# Patient Record
Sex: Female | Born: 1960 | Race: White | Hispanic: No | Smoking: Current every day smoker
Health system: Southern US, Community
[De-identification: ages and names within clinical notes are randomized; demographics above are authoritative.]

## PROBLEM LIST (undated history)

## (undated) DIAGNOSIS — K635 Polyp of colon: Secondary | ICD-10-CM

## (undated) DIAGNOSIS — N390 Urinary tract infection, site not specified: Secondary | ICD-10-CM

## (undated) DIAGNOSIS — F419 Anxiety disorder, unspecified: Secondary | ICD-10-CM

## (undated) DIAGNOSIS — F32A Depression, unspecified: Secondary | ICD-10-CM

## (undated) DIAGNOSIS — F329 Major depressive disorder, single episode, unspecified: Secondary | ICD-10-CM

## (undated) DIAGNOSIS — R32 Unspecified urinary incontinence: Secondary | ICD-10-CM

## (undated) DIAGNOSIS — M1990 Unspecified osteoarthritis, unspecified site: Secondary | ICD-10-CM

## (undated) DIAGNOSIS — F319 Bipolar disorder, unspecified: Secondary | ICD-10-CM

## (undated) DIAGNOSIS — R7303 Prediabetes: Secondary | ICD-10-CM

## (undated) DIAGNOSIS — N8110 Cystocele, unspecified: Secondary | ICD-10-CM

## (undated) DIAGNOSIS — E785 Hyperlipidemia, unspecified: Secondary | ICD-10-CM

## (undated) HISTORY — DX: Depression, unspecified: F32.A

## (undated) HISTORY — DX: Unspecified osteoarthritis, unspecified site: M19.90

## (undated) HISTORY — PX: BREAST SURGERY: SHX581

## (undated) HISTORY — DX: Urinary tract infection, site not specified: N39.0

## (undated) HISTORY — DX: Major depressive disorder, single episode, unspecified: F32.9

## (undated) HISTORY — DX: Anxiety disorder, unspecified: F41.9

## (undated) HISTORY — PX: APPENDECTOMY: SHX54

## (undated) HISTORY — PX: COLON SURGERY: SHX602

## (undated) HISTORY — DX: Prediabetes: R73.03

## (undated) HISTORY — DX: Polyp of colon: K63.5

## (undated) HISTORY — DX: Cystocele, unspecified: N81.10

## (undated) HISTORY — DX: Unspecified urinary incontinence: R32

## (undated) HISTORY — DX: Hyperlipidemia, unspecified: E78.5

## (undated) HISTORY — DX: Bipolar disorder, unspecified: F31.9

---

## 1983-10-10 HISTORY — PX: CHOLECYSTECTOMY: SHX55

## 1985-10-09 HISTORY — PX: TUBAL LIGATION: SHX77

## 2010-10-09 HISTORY — PX: BREAST EXCISIONAL BIOPSY: SUR124

## 2012-10-09 HISTORY — PX: ABDOMINAL HYSTERECTOMY: SHX81

## 2013-10-09 HISTORY — PX: CYSTOCELE REPAIR: SHX163

## 2016-06-09 DIAGNOSIS — M79673 Pain in unspecified foot: Secondary | ICD-10-CM | POA: Diagnosis not present

## 2016-06-23 ENCOUNTER — Ambulatory Visit (INDEPENDENT_AMBULATORY_CARE_PROVIDER_SITE_OTHER): Payer: BLUE CROSS/BLUE SHIELD | Admitting: Podiatry

## 2016-06-23 ENCOUNTER — Ambulatory Visit (INDEPENDENT_AMBULATORY_CARE_PROVIDER_SITE_OTHER): Payer: BLUE CROSS/BLUE SHIELD

## 2016-06-23 ENCOUNTER — Encounter: Payer: Self-pay | Admitting: Podiatry

## 2016-06-23 ENCOUNTER — Ambulatory Visit: Payer: Self-pay | Admitting: Podiatry

## 2016-06-23 VITALS — BP 125/73 | HR 83 | Resp 16

## 2016-06-23 DIAGNOSIS — M6789 Other specified disorders of synovium and tendon, multiple sites: Secondary | ICD-10-CM | POA: Diagnosis not present

## 2016-06-23 DIAGNOSIS — M65872 Other synovitis and tenosynovitis, left ankle and foot: Secondary | ICD-10-CM

## 2016-06-23 DIAGNOSIS — M722 Plantar fascial fibromatosis: Secondary | ICD-10-CM

## 2016-06-23 DIAGNOSIS — M79673 Pain in unspecified foot: Secondary | ICD-10-CM | POA: Diagnosis not present

## 2016-06-23 DIAGNOSIS — M659 Synovitis and tenosynovitis, unspecified: Secondary | ICD-10-CM

## 2016-06-23 DIAGNOSIS — M76829 Posterior tibial tendinitis, unspecified leg: Secondary | ICD-10-CM

## 2016-06-23 NOTE — Progress Notes (Signed)
   Subjective:    Patient ID: Suzanne Chandler, female    DOB: 22-Oct-1960, 55 y.o.   MRN: [id number]  HPI    Review of Systems  Constitutional: Positive for activity change, diaphoresis, fatigue and unexpected weight change.  Cardiovascular: Positive for leg swelling.  Gastrointestinal: Positive for abdominal pain, constipation and diarrhea.  Endocrine: Positive for heat intolerance.  Genitourinary: Positive for frequency and urgency.  Musculoskeletal: Positive for arthralgias, gait problem and myalgias.  Neurological: Positive for weakness, light-headedness and numbness.  Hematological: Bruises/bleeds easily.  All other systems reviewed and are negative.      Objective:   Physical Exam        Assessment & Plan:

## 2016-06-25 MED ORDER — BETAMETHASONE SOD PHOS & ACET 6 (3-3) MG/ML IJ SUSP: 12.0000 mg | Freq: Once | INTRAMUSCULAR | Status: DC

## 2016-06-25 NOTE — Progress Notes (Signed)
Patient ID: Suzanne Chandler, female   DOB: 1960-11-21, 55 y.o.   MRN: [id number] Subjective:  55 year old female patient presents today for evaluation of bilateral foot and ankle pain. 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 At one point the patient did present to an urgent care, where she was prescribed a Medrol Dosepak however it did not help. Patient presents today for further treatment and evaluation  Objective: Physical Exam General: The patient is alert and oriented x3 in no acute distress.  Dermatology: Skin is warm, dry and supple bilateral lower extremities. Negative for open lesions or macerations.  Vascular: Palpable pedal pulses bilaterally. No edema or erythema noted. Capillary refill within normal limits.  Neurological: Epicritic and protective threshold grossly intact bilaterally.   Musculoskeletal Exam: Range of motion within normal limits to all pedal and ankle joints bilateral with exception of mild gastroc equinus bilaterally.. Muscle strength 5/5 in all groups bilateral.   Pain on palpation to the bilateral plantar fascia at the insertion of the calcaneus, more symptomatic on the left. Patient also has severe pain and tenderness to the anterior lateral and medial aspects of the left ankle joint. Pain on palpation to the posterior tibial tendon bilaterally however the pain is more so on the left.  Radiographic Exam:   Posterior heel spurs noted bilaterally. Normal osseous mineralization. Joint spaces preserved. No fracture/dislocation/boney destruction.     Assessment: #1 posterior tibial tendon dysfunction bilateral, greater on the left. #2 plantar fasciitis bilateral foot #3 ankle joint synovitis left #4 pain in bilateral feet and left ankle. Problem List Items Addressed This Visit    None    Visit Diagnoses    Foot pain, unspecified laterality    -  Primary   Relevant Orders   DG Foot Complete Right   DG Foot Complete Left        Plan of Care:  #1 Patient was evaluated. #2 Injection 0.5 mL Celestone Soluspan injected to the left ankle joint without incident. #3 injection of 0.5 mL Celestone Soluspan injected into the left plantar fascia at the insertion of the calcaneus. #4 today we dispensed plantar fascial braces bilaterally for midfoot arch and plantar fascial support. #5 instructed patient conservative treatment modalities including shoe gear modifications, oral anti-inflammatories, icing, and stretching exercises. #6 patient is to return to clinic in 4 weeks     Dr. Edrick Kins, [place]

## 2016-06-25 NOTE — Patient Instructions (Signed)
Plantar Fasciitis Plantar fasciitis is a painful foot condition that affects the heel. It occurs when the band of tissue that connects the toes to the heel bone (plantar fascia) becomes irritated. This can happen after exercising too much or doing other repetitive activities (overuse injury). The pain from plantar fasciitis can range from mild irritation to severe pain that makes it difficult for you to walk or move. The pain is usually worse in the morning or after you have been sitting or lying down for a while. CAUSES This condition may be caused by:  Standing for long periods of time.  Wearing shoes that do not fit.  Doing high-impact activities, including running, aerobics, and ballet.  Being overweight.  Having an abnormal way of walking (gait).  Having tight calf muscles.  Having high arches in your feet.  Starting a new athletic activity. SYMPTOMS The main symptom of this condition is heel pain. Other symptoms include:  Pain that gets worse after activity or exercise.  Pain that is worse in the morning or after resting.  Pain that goes away after you walk for a few minutes. DIAGNOSIS This condition may be diagnosed based on your signs and symptoms. Your health care provider will also do a physical exam to check for:  A tender area on the bottom of your foot.  A high arch in your foot.  Pain when you move your foot.  Difficulty moving your foot. You may also need to have imaging studies to confirm the diagnosis. These can include:  X-rays.  Ultrasound.  MRI. TREATMENT  Treatment for plantar fasciitis depends on the severity of the condition. Your treatment may include:  Rest, ice, and over-the-counter pain medicines to manage your pain.  Exercises to stretch your calves and your plantar fascia.  A splint that holds your foot in a stretched, upward position while you sleep (night splint).  Physical therapy to relieve symptoms and prevent problems in the  future.  Cortisone injections to relieve severe pain.  Extracorporeal shock wave therapy (ESWT) to stimulate damaged plantar fascia with electrical impulses. It is often used as a last resort before surgery.  Surgery, if other treatments have not worked after 12 months. HOME CARE INSTRUCTIONS  Take medicines only as directed by your health care provider.  Avoid activities that cause pain.  Roll the bottom of your foot over a bag of ice or a bottle of cold water. Do this for 20 minutes, 3-4 times a day.  Perform simple stretches as directed by your health care provider.  Try wearing athletic shoes with air-sole or gel-sole cushions or soft shoe inserts.  Wear a night splint while sleeping, if directed by your health care provider.  Keep all follow-up appointments with your health care provider. PREVENTION   Do not perform exercises or activities that cause heel pain.  Consider finding low-impact activities if you continue to have problems.  Lose weight if you need to. The best way to prevent plantar fasciitis is to avoid the activities that aggravate your plantar fascia. SEEK MEDICAL CARE IF:  Your symptoms do not go away after treatment with home care measures.  Your pain gets worse.  Your pain affects your ability to move or do your daily activities.   This information is not intended to replace advice given to you by your health care provider. Make sure you discuss any questions you have with your health care provider.   Document Released: 06/20/2001 Document Revised: 06/16/2015 Document Reviewed: 08/05/2014 Elsevier   Interactive Patient Education 2016 Elsevier Inc.  

## 2016-07-20 ENCOUNTER — Other Ambulatory Visit: Payer: Self-pay | Admitting: *Deleted

## 2016-07-20 ENCOUNTER — Inpatient Hospital Stay
Admission: RE | Admit: 2016-07-20 | Discharge: 2016-07-20 | Disposition: A | Discharge status: Home / Self Care | Payer: Self-pay | Source: Ambulatory Visit | Attending: *Deleted | Admitting: *Deleted

## 2016-07-20 DIAGNOSIS — Z9289 Personal history of other medical treatment: Secondary | ICD-10-CM

## 2016-07-21 ENCOUNTER — Encounter: Payer: Self-pay | Admitting: Podiatry

## 2016-07-21 ENCOUNTER — Ambulatory Visit (INDEPENDENT_AMBULATORY_CARE_PROVIDER_SITE_OTHER): Payer: BLUE CROSS/BLUE SHIELD | Admitting: Podiatry

## 2016-07-21 DIAGNOSIS — M7752 Other enthesopathy of left foot: Secondary | ICD-10-CM

## 2016-07-21 DIAGNOSIS — M779 Enthesopathy, unspecified: Secondary | ICD-10-CM

## 2016-07-21 DIAGNOSIS — M659 Synovitis and tenosynovitis, unspecified: Secondary | ICD-10-CM

## 2016-07-21 DIAGNOSIS — M25572 Pain in left ankle and joints of left foot: Secondary | ICD-10-CM

## 2016-07-21 DIAGNOSIS — M76822 Posterior tibial tendinitis, left leg: Secondary | ICD-10-CM

## 2016-07-23 MED ORDER — BETAMETHASONE SOD PHOS & ACET 6 (3-3) MG/ML IJ SUSP: 12.0000 mg | Freq: Once | INTRAMUSCULAR | Status: DC

## 2016-07-23 NOTE — Progress Notes (Signed)
Subjective:  Patient presents today for pain and tenderness to the left ankl and foot e. Patient relates significant pain and tenderness when walking.  Patient was last seen for plantar fasciitis to the bilateral feet and she states that her condition has improved significantly. Patient states she got new shoes and also over-the-counter inserts. She still wearing her plantar fascial braces. Patient presents for further treatment and evaluation.  Objective / Physical Exam:  General:  The patient is alert and oriented x3 in no acute distress. Dermatology:  Skin is warm, dry and supple bilateral lower extremities. Negative for open lesions or macerations. Vascular:  Palpable pedal pulses bilaterally. No edema or erythema noted. Capillary refill within normal limits. Neurological:  Epicritic and protective threshold grossly intact bilaterally.  Musculoskeletal Exam:  Pain on palpation to the anterior lateral medial aspects of the patient's left ankle. Mild edema noted.  Range of motion within normal limits to all pedal and ankle joints bilateral. Muscle strength 5/5 in all groups bilateral.   Radiographic Exam:  Normal osseous mineralization. Joint spaces preserved. No fracture/dislocation/boney destruction.    Assessment: #1 posterior tibial tendon enthesopathy left  #2 ankle pain left #3 plantar fasciitis bilateral-healed   Plan of Care:  #1 Patient was evaluated. #2 injection of 0.5 mL Celestone Soluspan injected in the patient's left ankle and insertion of the PT tendon. #3 patient is to return to clinic when necessary   Dr. Edrick Kins, [place]

## 2016-07-27 ENCOUNTER — Other Ambulatory Visit: Payer: Self-pay | Admitting: Obstetrics & Gynecology

## 2016-07-27 DIAGNOSIS — N6321 Unspecified lump in the left breast, upper outer quadrant: Secondary | ICD-10-CM

## 2016-08-02 DIAGNOSIS — F331 Major depressive disorder, recurrent, moderate: Secondary | ICD-10-CM | POA: Diagnosis not present

## 2016-08-17 ENCOUNTER — Encounter: Payer: Self-pay | Admitting: Radiology

## 2016-08-17 ENCOUNTER — Ambulatory Visit
Admission: RE | Admit: 2016-08-17 | Discharge: 2016-08-17 | Disposition: A | Discharge status: Home / Self Care | Payer: BLUE CROSS/BLUE SHIELD | Source: Ambulatory Visit | Attending: Obstetrics & Gynecology | Admitting: Obstetrics & Gynecology

## 2016-08-17 DIAGNOSIS — N6321 Unspecified lump in the left breast, upper outer quadrant: Secondary | ICD-10-CM

## 2016-08-17 DIAGNOSIS — N6489 Other specified disorders of breast: Secondary | ICD-10-CM | POA: Insufficient documentation

## 2016-08-17 DIAGNOSIS — R928 Other abnormal and inconclusive findings on diagnostic imaging of breast: Secondary | ICD-10-CM | POA: Diagnosis not present

## 2016-08-18 DIAGNOSIS — F331 Major depressive disorder, recurrent, moderate: Secondary | ICD-10-CM | POA: Diagnosis not present

## 2016-08-29 ENCOUNTER — Ambulatory Visit (INDEPENDENT_AMBULATORY_CARE_PROVIDER_SITE_OTHER): Payer: BLUE CROSS/BLUE SHIELD | Admitting: Podiatry

## 2016-08-29 ENCOUNTER — Encounter: Payer: Self-pay | Admitting: Primary Care

## 2016-08-29 ENCOUNTER — Ambulatory Visit (INDEPENDENT_AMBULATORY_CARE_PROVIDER_SITE_OTHER): Payer: BLUE CROSS/BLUE SHIELD | Admitting: Primary Care

## 2016-08-29 ENCOUNTER — Telehealth: Payer: Self-pay | Admitting: *Deleted

## 2016-08-29 VITALS — BP 108/60 | HR 61 | Temp 98.2°F | Ht 66.0 in | Wt 166.0 lb

## 2016-08-29 DIAGNOSIS — M2141 Flat foot [pes planus] (acquired), right foot: Secondary | ICD-10-CM | POA: Diagnosis not present

## 2016-08-29 DIAGNOSIS — M25572 Pain in left ankle and joints of left foot: Secondary | ICD-10-CM

## 2016-08-29 DIAGNOSIS — F418 Other specified anxiety disorders: Secondary | ICD-10-CM

## 2016-08-29 DIAGNOSIS — M659 Synovitis and tenosynovitis, unspecified: Secondary | ICD-10-CM | POA: Diagnosis not present

## 2016-08-29 DIAGNOSIS — M76822 Posterior tibial tendinitis, left leg: Secondary | ICD-10-CM | POA: Diagnosis not present

## 2016-08-29 DIAGNOSIS — M79672 Pain in left foot: Secondary | ICD-10-CM | POA: Diagnosis not present

## 2016-08-29 DIAGNOSIS — M779 Enthesopathy, unspecified: Secondary | ICD-10-CM

## 2016-08-29 DIAGNOSIS — M79671 Pain in right foot: Secondary | ICD-10-CM

## 2016-08-29 DIAGNOSIS — E785 Hyperlipidemia, unspecified: Secondary | ICD-10-CM | POA: Diagnosis not present

## 2016-08-29 DIAGNOSIS — R8290 Unspecified abnormal findings in urine: Secondary | ICD-10-CM | POA: Diagnosis not present

## 2016-08-29 DIAGNOSIS — M2142 Flat foot [pes planus] (acquired), left foot: Secondary | ICD-10-CM | POA: Diagnosis not present

## 2016-08-29 DIAGNOSIS — F419 Anxiety disorder, unspecified: Secondary | ICD-10-CM

## 2016-08-29 DIAGNOSIS — F329 Major depressive disorder, single episode, unspecified: Secondary | ICD-10-CM

## 2016-08-29 DIAGNOSIS — F32A Depression, unspecified: Secondary | ICD-10-CM | POA: Insufficient documentation

## 2016-08-29 LAB — COMPREHENSIVE METABOLIC PANEL
ALK PHOS: 91 U/L (ref 39–117)
ALT: 19 U/L (ref 0–35)
AST: 18 U/L (ref 0–37)
Albumin: 4.2 g/dL (ref 3.5–5.2)
BILIRUBIN TOTAL: 0.3 mg/dL (ref 0.2–1.2)
BUN: 16 mg/dL (ref 6–23)
CALCIUM: 10 mg/dL (ref 8.4–10.5)
CO2: 30 mEq/L (ref 19–32)
CREATININE: 0.76 mg/dL (ref 0.40–1.20)
Chloride: 106 mEq/L (ref 96–112)
GFR: 83.92 mL/min (ref >60.00)
GLUCOSE: 100 mg/dL — AB (ref 70–99)
Potassium: 4.7 mEq/L (ref 3.5–5.1)
Sodium: 142 mEq/L (ref 135–145)
TOTAL PROTEIN: 7 g/dL (ref 6.0–8.3)

## 2016-08-29 LAB — POC URINALSYSI DIPSTICK (AUTOMATED)
Bilirubin, UA: NEGATIVE
GLUCOSE UA: NEGATIVE
KETONES UA: NEGATIVE
LEUKOCYTES UA: NEGATIVE
Nitrite, UA: POSITIVE
Protein, UA: NEGATIVE
Urobilinogen, UA: NEGATIVE
pH, UA: 5.5

## 2016-08-29 LAB — LIPID PANEL
Cholesterol: 198 mg/dL (ref 0–200)
HDL: 28.5 mg/dL — AB (ref >39.00)
NONHDL: 169.65
TRIGLYCERIDES: 347 mg/dL — AB (ref 0.0–149.0)
Total CHOL/HDL Ratio: 7
VLDL: 69.4 mg/dL — ABNORMAL HIGH (ref 0.0–40.0)

## 2016-08-29 LAB — LDL CHOLESTEROL, DIRECT: Direct LDL: 116 mg/dL

## 2016-08-29 NOTE — Assessment & Plan Note (Signed)
Likely underlying bipolar disorder. Patient very energetic today, very difficult to keep on subject. Continue follow up through the [facility].

## 2016-08-29 NOTE — Patient Instructions (Addendum)
Complete lab work prior to leaving today. I will notify you of your results once received.   I will send off your urine for further testing and notify you of those results in several days.  It was a pleasure to meet you today! Please don't hesitate to call me with any questions. Welcome to [facility]!

## 2016-08-29 NOTE — Progress Notes (Signed)
Subjective:    Patient ID: Suzanne Chandler, female    DOB: 11-13-1960, 55 y.o.   MRN: [id number]  HPI  Suzanne Chandler is a 55 year old female who presents today to establish care and discuss the problems mentioned below. Will obtain old records. Her last physical was over 1 year ago.  1) Anxiety and Depression: Long history of depression, thinks she may have bipolar disorder as she will experience episodes of increased energy. She is currently following with the [facility] (Dr. Eudelia Bunch) and is also following with therapy. She was previously managed on Citalopram and Clonazepam with improvement. She is only taking Clonazepam PRN. She denies SI/HI.  2) Hyperlipidemia: Diagnosed in 2011. She was previously managed on fenofibrate in the past which caused her to feel dizzy. She is currently managed on plant sterols. She is fasting today. No recent lipid panel.  3) Foul Smelling Urine: History of prolapsed bladder. She denies vaginal itching and burning, dysuria, hematuria, abdominal pain. She's not taken anything OTC for her symptoms. She does have a history of UTI's in the past without her classic symptoms.   Review of Systems  Respiratory: Negative for shortness of breath.   Cardiovascular: Negative for chest pain.  Gastrointestinal: Negative for abdominal pain.  Genitourinary: Negative for dysuria, frequency and vaginal discharge.  Psychiatric/Behavioral: Negative for sleep disturbance. The patient is not nervous/anxious.        Following with [facility], feels well managed       Past Medical History:  Diagnosis Date  . Anxiety and depression   . Female bladder prolapse      Social History   Social History  . Marital status: Widowed    Spouse name: N/A  . Number of children: N/A  . Years of education: N/A   Occupational History  . Not on file.   Social History Main Topics  . Smoking status: Current Every Day Smoker    Packs/day: 1.00    Years: 33.00  . Smokeless  tobacco: Never Used  . Alcohol use Not on file  . Drug use: Unknown  . Sexual activity: Not on file   Other Topics Concern  . Not on file   Social History Narrative  . No narrative on file    Past Surgical History:  Procedure Laterality Date  . BREAST EXCISIONAL BIOPSY Right 05/2013   neg    Family History  Problem Relation Age of Onset  . Ovarian cancer Sister     No Known Allergies  Current Outpatient Prescriptions on File Prior to Visit  Medication Sig Dispense Refill  . clonazePAM (KLONOPIN) 0.5 MG tablet Take 0.25-0.5 mg by mouth 2 (two) times daily as needed for anxiety.     . Plant Sterols and Stanols (CHOLEST OFF PO) Take by mouth.     Current Facility-Administered Medications on File Prior to Visit  Medication Dose Route Frequency Provider Last Rate Last Dose  . betamethasone acetate-betamethasone sodium phosphate (CELESTONE) injection 12 mg  12 mg Intramuscular Once Edrick Kins, DPM      . betamethasone acetate-betamethasone sodium phosphate (CELESTONE) injection 12 mg  12 mg Intramuscular Once Edrick Kins, DPM        BP 108/60   Pulse 61   Temp 98.2 F (36.8 C) (Oral)   Ht 5\' 6"  (1.676 m)   Wt 166 lb (75.3 kg)   SpO2 98%   BMI 26.79 kg/m    Objective:   Physical Exam  Constitutional: She appears  well-nourished.  Neck: Neck supple.  Cardiovascular: Normal rate and regular rhythm.   Pulmonary/Chest: Effort normal and breath sounds normal.  Abdominal: Soft. Bowel sounds are normal. There is no tenderness. There is no CVA tenderness. A hernia is present. Hernia confirmed positive in the right inguinal area.  Skin: Skin is warm and dry.  Psychiatric: She has a normal mood and affect.  Very talkative, often went off on tangents, difficulty to keep her on subject.          Assessment & Plan:  Foul Smelling Urine:  History of UTI's without classic symptoms. Only symptom today of foul smell. No dysuria, hematuria, abdominal pain, vaginal  symptoms. UA: Negative leuks, positive nitrites, 1+ blood. Culture sent. Discussed to increase water intake, will await culture results prior to treatment.  Sheral Flow, NP

## 2016-08-29 NOTE — Assessment & Plan Note (Signed)
Endorses prior history. Lipid panel today with [facility] in the 300's.  Will have her start Fish Oil 1000 mg TID. Recheck lipids in 3 months.

## 2016-08-29 NOTE — Progress Notes (Signed)
Pre visit review using our clinic review tool, if applicable. No additional management support is needed unless otherwise documented below in the visit note. 

## 2016-08-29 NOTE — Telephone Encounter (Addendum)
-----   Message from Edrick Kins, DPM sent at 08/29/2016 11:45 AM EST ----- Regarding: MRI left foot Please arrange MRI left foot.   Dx : posterior tibial tendinitis left foot  Thanks!!!Orders faxed to [facility].

## 2016-08-29 NOTE — Progress Notes (Signed)
Subjective:  Patient presents today for follow-up evaluation of bilateral foot and ankle pain. Patient states that the injections that she received to her left posterior tibial tendon sheath helped for a couple of days. Patient states that her plantar fasciitis is no longer symptomatic. Patient also complains of moderate pain and tenderness to her left ankle joint. 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 Patient presents today for further treatment and evaluation  Objective / Physical Exam:  General:  The patient is alert and oriented x3 in no acute distress. Dermatology:  Skin is warm, dry and supple bilateral lower extremities. Negative for open lesions or macerations. Vascular:  Palpable pedal pulses bilaterally. No edema or erythema noted. Capillary refill within normal limits. Neurological:  Epicritic and protective threshold grossly intact bilaterally.  Musculoskeletal Exam:  Pain on palpation to the anterior lateral medial aspects of the patient's left ankle. Mild edema noted. Pain and tenderness also noted to the posterior tibial tendon sheath as well as the insertion of the posterior tibial tendon at the level of the navicular tuberosity. Moderate edema noted as well - unimproved since last visit. Range of motion within normal limits to all pedal and ankle joints bilateral. Muscle strength 5/5 in all groups bilateral.   Assessment: #1 posterior tibial tendon enthesopathy left  #2 ankle pain left #3 plantar fasciitis bilateral-healed  #4 pain in bilateral feet  Plan of Care:  #1 Patient was evaluated today. #2 today were going to order an MRI for the left ankle for possible posterior tibial tendon tear #3 today the patient was scanned for custom molded orthotics #4 ankle brace dispensed today for left ankle pain #5 return to clinic in 3 weeks for orthotic pickup  Dr. Edrick Kins, [place]

## 2016-09-01 ENCOUNTER — Other Ambulatory Visit: Payer: Self-pay | Admitting: Primary Care

## 2016-09-01 DIAGNOSIS — N390 Urinary tract infection, site not specified: Secondary | ICD-10-CM

## 2016-09-01 DIAGNOSIS — R319 Hematuria, unspecified: Principal | ICD-10-CM

## 2016-09-01 LAB — URINE CULTURE

## 2016-09-01 MED ORDER — SULFAMETHOXAZOLE-TRIMETHOPRIM 800-160 MG PO TABS: 1.0000 | ORAL_TABLET | Freq: Two times a day (BID) | ORAL | 0 refills | Status: DC

## 2016-09-03 ENCOUNTER — Encounter: Payer: Self-pay | Admitting: Primary Care

## 2016-09-05 ENCOUNTER — Encounter: Payer: Self-pay | Admitting: *Deleted

## 2016-09-11 ENCOUNTER — Encounter: Payer: Self-pay | Admitting: *Deleted

## 2016-09-12 ENCOUNTER — Ambulatory Visit
Admission: RE | Admit: 2016-09-12 | Discharge: 2016-09-12 | Disposition: A | Discharge status: Home / Self Care | Payer: BLUE CROSS/BLUE SHIELD | Source: Ambulatory Visit | Attending: Podiatry | Admitting: Podiatry

## 2016-09-12 DIAGNOSIS — M7989 Other specified soft tissue disorders: Secondary | ICD-10-CM | POA: Diagnosis not present

## 2016-09-12 DIAGNOSIS — M779 Enthesopathy, unspecified: Secondary | ICD-10-CM | POA: Insufficient documentation

## 2016-09-12 DIAGNOSIS — M79672 Pain in left foot: Secondary | ICD-10-CM | POA: Diagnosis not present

## 2016-09-19 ENCOUNTER — Ambulatory Visit (INDEPENDENT_AMBULATORY_CARE_PROVIDER_SITE_OTHER): Payer: BLUE CROSS/BLUE SHIELD | Admitting: Podiatry

## 2016-09-19 DIAGNOSIS — M76829 Posterior tibial tendinitis, unspecified leg: Secondary | ICD-10-CM

## 2016-09-19 DIAGNOSIS — M25572 Pain in left ankle and joints of left foot: Secondary | ICD-10-CM

## 2016-09-19 DIAGNOSIS — M2140 Flat foot [pes planus] (acquired), unspecified foot: Secondary | ICD-10-CM

## 2016-09-19 DIAGNOSIS — M76822 Posterior tibial tendinitis, left leg: Secondary | ICD-10-CM | POA: Diagnosis not present

## 2016-09-19 DIAGNOSIS — M79672 Pain in left foot: Secondary | ICD-10-CM | POA: Diagnosis not present

## 2016-09-19 DIAGNOSIS — M659 Synovitis and tenosynovitis, unspecified: Secondary | ICD-10-CM

## 2016-09-19 DIAGNOSIS — M779 Enthesopathy, unspecified: Secondary | ICD-10-CM

## 2016-09-19 DIAGNOSIS — M79671 Pain in right foot: Secondary | ICD-10-CM | POA: Diagnosis not present

## 2016-09-19 MED ORDER — NONFORMULARY OR COMPOUNDED ITEM: 1.0000 g | Freq: Four times a day (QID) | 2 refills | Status: DC

## 2016-09-19 NOTE — Patient Instructions (Signed)

## 2016-09-19 NOTE — Progress Notes (Signed)
Patient ID: Suzanne Chandler, female   DOB: May 25, 1961, 55 y.o.   MRN: [id number] Subjective:  Patient presents today for follow-up evaluation of bilateral foot and ankle pain. Patient states that the injections that she received to her left posterior tibial tendon sheath helped for a couple of days. Patient states that her plantar fasciitis is no longer symptomatic. Patient also complains of moderate pain and tenderness to her left ankle joint. 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 Patient presents today for further treatment and evaluation  Objective / Physical Exam:  General:  The patient is alert and oriented x3 in no acute distress. Dermatology:  Skin is warm, dry and supple bilateral lower extremities. Negative for open lesions or macerations. Vascular:  Palpable pedal pulses bilaterally. No edema or erythema noted. Capillary refill within normal limits. Neurological:  Epicritic and protective threshold grossly intact bilaterally.  Musculoskeletal Exam:  Pain on palpation to the anterior lateral medial aspects of the patient's left ankle. Mild edema noted. Pain and tenderness also noted to the posterior tibial tendon sheath as well as the insertion of the posterior tibial tendon at the level of the navicular tuberosity. Moderate edema noted as well - unimproved since last visit. Range of motion within normal limits to all pedal and ankle joints bilateral. Muscle strength 5/5 in all groups bilateral.   Assessment: #1 posterior tibial tendon tendinitis left  #2 ankle pain left #3 plantar fasciitis bilateral-healed  #4 pain in bilateral feet  Plan of Care:  #1 Patient was evaluated today. #2 today MRI was reviewed with findings of edema and fluid within the tendon sheath of the posterior tibial tendon as well as posterior tibial tendinosis. #3 discussed etiology and conservative management of posterior tibial tendinosis and PTTD #4 continue ankle brace. #5  today orthotics were dispensed. Verbal as well as written instructions for break-in period were provided #6 prescription for anti-inflammatory pain cream dispensed through [facility] #7 return to clinic in 4 weeks  Greater than 15 minutes were spent with the patient  Dr. Edrick Kins, [place]

## 2016-09-19 NOTE — Progress Notes (Signed)
Patient ID: Suzanne Chandler, female   DOB: March 26, 1961, 55 y.o.   MRN: [id number]  Patient presents for orthotic pick up.  Verbal and written break in and wear instructions given.  Patient will follow up in 4 weeks if symptoms worsen or fail to improve.

## 2016-09-26 DIAGNOSIS — F331 Major depressive disorder, recurrent, moderate: Secondary | ICD-10-CM | POA: Diagnosis not present

## 2016-10-17 ENCOUNTER — Ambulatory Visit: Payer: BLUE CROSS/BLUE SHIELD | Admitting: Podiatry

## 2016-10-17 DIAGNOSIS — F331 Major depressive disorder, recurrent, moderate: Secondary | ICD-10-CM | POA: Diagnosis not present

## 2016-11-07 ENCOUNTER — Encounter: Payer: Self-pay | Admitting: Podiatry

## 2016-11-07 ENCOUNTER — Ambulatory Visit (INDEPENDENT_AMBULATORY_CARE_PROVIDER_SITE_OTHER): Payer: BLUE CROSS/BLUE SHIELD | Admitting: Podiatry

## 2016-11-07 DIAGNOSIS — M2141 Flat foot [pes planus] (acquired), right foot: Secondary | ICD-10-CM

## 2016-11-07 DIAGNOSIS — M76822 Posterior tibial tendinitis, left leg: Secondary | ICD-10-CM | POA: Diagnosis not present

## 2016-11-07 DIAGNOSIS — M722 Plantar fascial fibromatosis: Secondary | ICD-10-CM

## 2016-11-07 DIAGNOSIS — M659 Synovitis and tenosynovitis, unspecified: Secondary | ICD-10-CM

## 2016-11-07 DIAGNOSIS — M25572 Pain in left ankle and joints of left foot: Secondary | ICD-10-CM | POA: Diagnosis not present

## 2016-11-07 DIAGNOSIS — M2142 Flat foot [pes planus] (acquired), left foot: Secondary | ICD-10-CM

## 2016-11-07 DIAGNOSIS — M779 Enthesopathy, unspecified: Secondary | ICD-10-CM | POA: Diagnosis not present

## 2016-11-18 NOTE — Progress Notes (Signed)
Patient ID: Suzanne Chandler, female   DOB: 12-08-1960, 56 y.o.   MRN: [id number] Subjective:  Patient presents today for follow-up treatment and evaluation of bilateral plantar fasciitis as well as posterior tibial tendinitis to the left lower extremity. Patient states the orthotics that she received last visit are doing well. She is still having pain in the left foot worse than the right. 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 Patient presents today for further treatment and evaluation  Objective / Physical Exam:  General:  The patient is alert and oriented x3 in no acute distress. Dermatology:  Skin is warm, dry and supple bilateral lower extremities. Negative for open lesions or macerations. Vascular:  Palpable pedal pulses bilaterally. No edema or erythema noted. Capillary refill within normal limits. Neurological:  Epicritic and protective threshold grossly intact bilaterally.  Musculoskeletal Exam:  Pain on palpation to the anterior lateral medial aspects of the patient's left ankle. Mild edema noted. Pain and tenderness also noted to the posterior tibial tendon sheath as well as the insertion of the posterior tibial tendon at the level of the navicular tuberosity. Moderate edema noted as well - unimproved since last visit. Range of motion within normal limits to all pedal and ankle joints bilateral. Muscle strength 5/5 in all groups bilateral.   Assessment: #1 posterior tibial tendon tendinitis left  #2 ankle pain left #3 plantar fasciitis bilateral-healed  #4 pain in bilateral feet  Plan of Care:  #1 Patient was evaluated today. #2 MRI findings of edema and fluid within the tendon sheath of the posterior tibial tendon as well as posterior tibial tendinosis. #3 cam boot dispensed today for mobilization of left lower extremity. #4 continue anti-inflammatory pain cream [facility] #5 return to clinic in 4 weeks   Dr. Edrick Kins, [place]

## 2016-11-21 DIAGNOSIS — F331 Major depressive disorder, recurrent, moderate: Secondary | ICD-10-CM | POA: Diagnosis not present

## 2016-12-05 ENCOUNTER — Encounter: Payer: Self-pay | Admitting: Podiatry

## 2016-12-05 ENCOUNTER — Ambulatory Visit (INDEPENDENT_AMBULATORY_CARE_PROVIDER_SITE_OTHER): Payer: BLUE CROSS/BLUE SHIELD | Admitting: Podiatry

## 2016-12-05 DIAGNOSIS — M76822 Posterior tibial tendinitis, left leg: Secondary | ICD-10-CM | POA: Diagnosis not present

## 2016-12-05 DIAGNOSIS — M659 Synovitis and tenosynovitis, unspecified: Secondary | ICD-10-CM

## 2016-12-05 DIAGNOSIS — M779 Enthesopathy, unspecified: Secondary | ICD-10-CM | POA: Diagnosis not present

## 2016-12-06 ENCOUNTER — Other Ambulatory Visit: Payer: Self-pay | Admitting: Primary Care

## 2016-12-06 DIAGNOSIS — E785 Hyperlipidemia, unspecified: Secondary | ICD-10-CM

## 2016-12-06 DIAGNOSIS — R739 Hyperglycemia, unspecified: Secondary | ICD-10-CM

## 2016-12-18 NOTE — Progress Notes (Signed)
Patient ID: Suzanne Chandler, female   DOB: [birth year], 56 y.o.   MRN: [id number] Subjective:  Patient presents today for follow-up evaluation of posterior tibial tendinitis as well as ankle pain to the left lower extremity. She does have a history of plantar fasciitis bilateral which is healed. Patient states that she is getting relief with the immobilization cam boot. 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 Patient presents today for further treatment and evaluation  Objective / Physical Exam:  General:  The patient is alert and oriented x3 in no acute distress. Dermatology:  Skin is warm, dry and supple bilateral lower extremities. Negative for open lesions or macerations. Vascular:  Palpable pedal pulses bilaterally. No edema or erythema noted. Capillary refill within normal limits. Neurological:  Epicritic and protective threshold grossly intact bilaterally.  Musculoskeletal Exam:  Minimal Pain on palpation to the anterior lateral medial aspects of the patient's left ankle. Mild edema noted. Pain and tenderness also noted to the posterior tibial tendon sheath as well as the insertion of the posterior tibial tendon at the level of the navicular tuberosity. Moderate edema noted as well - unimproved since last visit. Range of motion within normal limits to all pedal and ankle joints bilateral. Muscle strength 5/5 in all groups bilateral.   Assessment: #1 posterior tibial tendon tendinitis left -improving #2 ankle pain left-improving #3 plantar fasciitis bilateral-healed  #4 pain in bilateral feet  Plan of Care:  #1 Patient was evaluated today. #2 today the patient can begin transitioning out of the immobilization cam boot into good supportive tennis shoes with an ankle brace. #3 recommend an over-the-counter ankle brace #4 return to clinic when necessary  Dr. Edrick Kins, [place]

## 2016-12-19 ENCOUNTER — Other Ambulatory Visit: Payer: BLUE CROSS/BLUE SHIELD

## 2016-12-19 ENCOUNTER — Other Ambulatory Visit (INDEPENDENT_AMBULATORY_CARE_PROVIDER_SITE_OTHER): Payer: BLUE CROSS/BLUE SHIELD

## 2016-12-19 DIAGNOSIS — E785 Hyperlipidemia, unspecified: Secondary | ICD-10-CM | POA: Diagnosis not present

## 2016-12-19 DIAGNOSIS — R739 Hyperglycemia, unspecified: Secondary | ICD-10-CM

## 2016-12-19 LAB — LIPID PANEL
CHOLESTEROL: 193 mg/dL (ref 0–200)
HDL: 29.2 mg/dL — AB (ref >39.00)
LDL CALC: 131 mg/dL — AB (ref 0–99)
NonHDL: 163.63
Total CHOL/HDL Ratio: 7
Triglycerides: 164 mg/dL — ABNORMAL HIGH (ref 0.0–149.0)
VLDL: 32.8 mg/dL (ref 0.0–40.0)

## 2016-12-19 LAB — HEMOGLOBIN A1C: HEMOGLOBIN A1C: 6 % (ref 4.6–6.5)

## 2016-12-25 DIAGNOSIS — F331 Major depressive disorder, recurrent, moderate: Secondary | ICD-10-CM | POA: Diagnosis not present

## 2017-01-16 ENCOUNTER — Encounter: Payer: Self-pay | Admitting: *Deleted

## 2017-01-16 ENCOUNTER — Encounter: Payer: Self-pay | Admitting: Primary Care

## 2017-01-16 ENCOUNTER — Other Ambulatory Visit: Payer: Self-pay | Admitting: Primary Care

## 2017-01-16 ENCOUNTER — Ambulatory Visit (INDEPENDENT_AMBULATORY_CARE_PROVIDER_SITE_OTHER): Payer: BLUE CROSS/BLUE SHIELD | Admitting: Primary Care

## 2017-01-16 ENCOUNTER — Telehealth: Payer: Self-pay

## 2017-01-16 VITALS — BP 102/72 | HR 75 | Temp 97.5°F | Ht 66.0 in | Wt 166.4 lb

## 2017-01-16 DIAGNOSIS — N952 Postmenopausal atrophic vaginitis: Secondary | ICD-10-CM

## 2017-01-16 DIAGNOSIS — Z1211 Encounter for screening for malignant neoplasm of colon: Secondary | ICD-10-CM | POA: Diagnosis not present

## 2017-01-16 DIAGNOSIS — Z0000 Encounter for general adult medical examination without abnormal findings: Secondary | ICD-10-CM | POA: Diagnosis not present

## 2017-01-16 DIAGNOSIS — F331 Major depressive disorder, recurrent, moderate: Secondary | ICD-10-CM | POA: Diagnosis not present

## 2017-01-16 DIAGNOSIS — F32A Depression, unspecified: Secondary | ICD-10-CM

## 2017-01-16 DIAGNOSIS — R7303 Prediabetes: Secondary | ICD-10-CM | POA: Diagnosis not present

## 2017-01-16 DIAGNOSIS — E559 Vitamin D deficiency, unspecified: Secondary | ICD-10-CM | POA: Insufficient documentation

## 2017-01-16 DIAGNOSIS — E785 Hyperlipidemia, unspecified: Secondary | ICD-10-CM | POA: Diagnosis not present

## 2017-01-16 DIAGNOSIS — F329 Major depressive disorder, single episode, unspecified: Secondary | ICD-10-CM

## 2017-01-16 DIAGNOSIS — F418 Other specified anxiety disorders: Secondary | ICD-10-CM | POA: Diagnosis not present

## 2017-01-16 DIAGNOSIS — F419 Anxiety disorder, unspecified: Secondary | ICD-10-CM

## 2017-01-16 LAB — VITAMIN D 25 HYDROXY (VIT D DEFICIENCY, FRACTURES): VITD: 26.59 ng/mL — ABNORMAL LOW (ref 30.00–100.00)

## 2017-01-16 LAB — TSH: TSH: 1.41 u[IU]/mL (ref 0.35–4.50)

## 2017-01-16 NOTE — Patient Instructions (Addendum)
Complete lab work prior to leaving today. I will notify you of your results once received.   It's important to improve your diet by reducing consumption of fast food, fried food, processed snack foods, sugary drinks. Increase consumption of fresh vegetables and fruits, whole grains, water.  Ensure you are drinking 64 ounces of water daily.  Start exercising. You should be getting 150 minutes of moderate intensity exercise weekly.  You will be contacted regarding your referral to GI for the colonoscopy.  Please let us know if you have not heard back within one week.   Schedule your mammogram later this November 2018.  Schedule a lab only appointment in 6 months for re-evaluation of your blood sugar and cholesterol.   Follow up in 1 year for your annual exam.  It was a pleasure to see you today!   Prediabetes Prediabetes is the condition of having a blood sugar (blood glucose) level that is higher than it should be, but not high enough for you to be diagnosed with type 2 diabetes. Having prediabetes puts you at risk for developing type 2 diabetes (type 2 diabetes mellitus). Prediabetes may be called impaired glucose tolerance or impaired fasting glucose. Prediabetes usually does not cause symptoms. Your health care provider can diagnose this condition with blood tests. You may be tested for prediabetes if you are overweight and if you have at least one other risk factor for prediabetes. Risk factors for prediabetes include:  Having a family member with type 2 diabetes.  Being overweight or obese.  Being older than age 8.  Being of American-Indian, African-American, Hispanic/Latino, or Asian/Pacific Islander descent.  Having an inactive (sedentary) lifestyle.  Having a history of gestational diabetes or polycystic ovarian syndrome (PCOS).  Having low levels of good cholesterol (HDL-C) or high levels of blood fats (triglycerides).  Having high blood pressure. What is blood glucose  and how is blood glucose measured?   Blood glucose refers to the amount of glucose in your bloodstream. Glucose comes from eating foods that contain sugars and starches (carbohydrates) that the body breaks down into glucose. Your blood glucose level may be measured in mg/dL (milligrams per deciliter) or mmol/L (millimoles per liter).Your blood glucose may be checked with one or more of the following blood tests:  A fasting blood glucose (FBG) test. You will not be allowed to eat (you will fast) for at least 8 hours before a blood sample is taken.  A normal range for FBG is 70-100 mg/dl (3.9-5.6 mmol/L).  An A1c (hemoglobin A1c) blood test. This test provides information about blood glucose control over the previous 2?74months.  An oral glucose tolerance test (OGTT). This test measures your blood glucose twice:  After fasting. This is your baseline level.  Two hours after you drink a beverage that contains glucose. You may be diagnosed with prediabetes:  If your FBG is 100?125 mg/dL (5.6-6.9 mmol/L).  If your A1c level is 5.7?6.4%.  If your OGGT result is 140?199 mg/dL (7.8-11 mmol/L). These blood tests may be repeated to confirm your diagnosis. What happens if blood glucose is too high? The pancreas produces a hormone (insulin) that helps move glucose from the bloodstream into cells. When cells in the body do not respond properly to insulin that the body makes (insulin resistance), excess glucose builds up in the blood instead of going into cells. As a result, high blood glucose (hyperglycemia) can develop, which can cause many complications. This is a symptom of prediabetes. What can happen if blood  glucose stays higher than normal for a long time? Having high blood glucose for a long time is dangerous. Too much glucose in your blood can damage your nerves and blood vessels. Long-term damage can lead to complications from diabetes, which may include:  Heart  disease.  Stroke.  Blindness.  Kidney disease.  Depression.  Poor circulation in the feet and legs, which could lead to surgical removal (amputation) in severe cases. How can prediabetes be prevented from turning into type 2 diabetes?   To help prevent type 2 diabetes, take the following actions:  Be physically active.  Do moderate-intensity physical activity for at least 30 minutes on at least 5 days of the week, or as much as told by your health care provider. This could be brisk walking, biking, or water aerobics.  Ask your health care provider what activities are safe for you. A mix of physical activities may be best, such as walking, swimming, cycling, and strength training.  Lose weight as told by your health care provider.  Losing 5-7% of your body weight can reverse insulin resistance.  Your health care provider can determine how much weight loss is best for you and can help you lose weight safely.  Follow a healthy meal plan. This includes eating lean proteins, complex carbohydrates, fresh fruits and vegetables, low-fat dairy products, and healthy fats.  Follow instructions from your health care provider about eating or drinking restrictions.  Make an appointment to see a diet and nutrition specialist (registered dietitian) to help you create a healthy eating plan that is right for you.  Do not smoke or use any tobacco products, such as cigarettes, chewing tobacco, and e-cigarettes. If you need help quitting, ask your health care provider.  Take over-the-counter and prescription medicines as told by your health care provider. You may be prescribed medicines that help lower the risk of type 2 diabetes. This information is not intended to replace advice given to you by your health care provider. Make sure you discuss any questions you have with your health care provider. Document Released: 01/17/2016 Document Revised: 03/02/2016 Document Reviewed: 11/16/2015 Elsevier  Interactive Patient Education  2017 [employer].

## 2017-01-16 NOTE — Assessment & Plan Note (Signed)
Following with psychiatry and therapy now. Recent adjustment to meds, has an appointment with psychiatry today. Denies SI/HI.

## 2017-01-16 NOTE — Assessment & Plan Note (Signed)
Taking Fish Oil 1200 mg TID and OTC "cholesterol" medication. Lipids due again today.

## 2017-01-16 NOTE — Telephone Encounter (Signed)
Pt left v/m; pt was seen earlier today. Pt wants to know if estradiol 1 mg gel or cream could be prescribed. If not could pt get estrace 1 mg. [facility] [address].

## 2017-01-16 NOTE — Progress Notes (Signed)
Pre visit review using our clinic review tool, if applicable. No additional management support is needed unless otherwise documented below in the visit note. 

## 2017-01-16 NOTE — Assessment & Plan Note (Signed)
Td UTD. Mammogram UTD, due again in 2018. Colonoscopy due, ordered. Discussed the importance of a healthy diet and regular exercise in order for weight loss, and to reduce the risk of other medical diseases. Exam unremarkable. Labs with prediabetes and elevation in [place]. Continue to monitor both. Follow up annually.

## 2017-01-16 NOTE — Telephone Encounter (Signed)
What are her symptoms? Has she ever been on this type of medication in the past? Has she tried anything OTC? Please answer all questions.

## 2017-01-16 NOTE — Assessment & Plan Note (Signed)
Taking 5000 units of vitamin D twice weekly.  Vitamin D level due today.

## 2017-01-16 NOTE — Assessment & Plan Note (Signed)
Recent A1C of 6.0. Discussed the importance of a healthy diet and regular exercise in order for weight loss, and to reduce the risk of other medical diseases. Recheck A1C in 6 months.

## 2017-01-16 NOTE — Telephone Encounter (Signed)
Left message on cell phone for pt to call back her primary care office.

## 2017-01-16 NOTE — Progress Notes (Signed)
Subjective:    Patient ID: Suzanne Chandler, female    DOB: 1961/07/12, 56 y.o.   MRN: [id number]  HPI  Suzanne Chandler is a 56 year old female who presents today for complete physical.  Immunizations: -Tetanus: Completed in 2012 -Influenza: She did not complete last season   Diet: She endorses a fair diet Breakfast: Skips, Cereal Lunch: Chicken wings Dinner: Vegetables, meat, soup, starch Snacks: Chocoloate Desserts: Daily Beverages: Chocolate milk  Exercise: She does not currently exercise Eye exam: Completed several years ago Dental exam: Due soon, scheduled at the end of May Colonoscopy: Never completed. Due. Pap Smear: Hysterectomy  Mammogram: Completed completed in November 2017, due in 2018   Review of Systems  Constitutional: Negative for unexpected weight change.  HENT: Negative for rhinorrhea.   Respiratory: Negative for cough and shortness of breath.   Cardiovascular: Negative for chest pain.  Gastrointestinal: Negative for constipation and diarrhea.  Genitourinary: Negative for difficulty urinating and menstrual problem.  Musculoskeletal: Negative for arthralgias and myalgias.  Skin: Negative for rash.  Allergic/Immunologic: Negative for environmental allergies.  Neurological: Negative for dizziness, numbness and headaches.  Psychiatric/Behavioral:       Feeling stressed, following with therapist and psychiatry.       Past Medical History:  Diagnosis Date  . Anxiety and depression   . Bipolar disorder ([place])   . Borderline diabetes   . Female bladder prolapse   . Hyperlipemia   . Urinary tract infection      Social History   Social History  . Marital status: Widowed    Spouse name: N/A  . Number of children: N/A  . Years of education: N/A   Occupational History  . Not on file.   Social History Main Topics  . Smoking status: Current Every Day Smoker    Packs/day: 1.00    Years: 33.00  . Smokeless tobacco: Never Used  . Alcohol use  Not on file  . Drug use: Unknown  . Sexual activity: Not on file   Other Topics Concern  . Not on file   Social History Narrative   Widow   Works in Therapist, art.   Highest level of education is GED.   Enjoys spending time with family.    Past Surgical History:  Procedure Laterality Date  . ABDOMINAL HYSTERECTOMY  2014  . BREAST EXCISIONAL BIOPSY Right 05/2013   neg  . CHOLECYSTECTOMY  1985  . CYSTOCELE REPAIR  2015  . TUBAL LIGATION  1987    Family History  Problem Relation Age of Onset  . Ovarian cancer Sister   . Arthritis Sister   . COPD Sister   . Anxiety disorder Sister   . Hyperlipidemia Mother   . Hypertension Mother   . Diabetes Mother   . Depression Mother   . Heart disease Father   . Stroke Father   . Heart attack Father   . Depression Brother   . Anxiety disorder Brother     No Known Allergies  Current Outpatient Prescriptions on File Prior to Visit  Medication Sig Dispense Refill  . clonazePAM (KLONOPIN) 0.5 MG tablet Take 0.25-0.5 mg by mouth 2 (two) times daily as needed for anxiety.     . lamoTRIgine (LAMICTAL) 25 MG tablet   0  . NONFORMULARY OR COMPOUNDED ITEM Apply 1-2 g topically 4 (four) times daily. 120 each 2  . Plant Sterols and Stanols (CHOLEST OFF PO) Take by mouth.    . QUEtiapine (SEROQUEL XR) 50 MG  TB24 24 hr tablet Take 50 mg by mouth daily.   0  . QUEtiapine (SEROQUEL) 25 MG tablet   0  . VIIBRYD 20 MG TABS   0  . VIIBRYD STARTER PACK 10 & 20 MG KIT   0   Current Facility-Administered Medications on File Prior to Visit  Medication Dose Route Frequency Provider Last Rate Last Dose  . betamethasone acetate-betamethasone sodium phosphate (CELESTONE) injection 12 mg  12 mg Intramuscular Once Edrick Kins, DPM      . betamethasone acetate-betamethasone sodium phosphate (CELESTONE) injection 12 mg  12 mg Intramuscular Once Edrick Kins, DPM        BP 102/72   Pulse 75   Temp 97.5 F (36.4 C) (Oral)   Ht _0  (1.676 m)    Wt 166 lb 6.4 oz (75.5 kg)   SpO2 98%   BMI 26.86 kg/m    Objective:   Physical Exam  Constitutional: She is oriented to person, place, and time. She appears well-nourished.  HENT:  Right Ear: Tympanic membrane and ear canal normal.  Left Ear: Tympanic membrane and ear canal normal.  Nose: Nose normal.  Mouth/Throat: Oropharynx is clear and moist.  Eyes: Conjunctivae and EOM are normal. Pupils are equal, round, and reactive to light.  Neck: Neck supple. No thyromegaly present.  Cardiovascular: Normal rate and regular rhythm.   No murmur heard. Pulmonary/Chest: Effort normal and breath sounds normal. She has no rales.  Abdominal: Soft. Bowel sounds are normal. There is no tenderness.  Musculoskeletal: Normal range of motion.  Lymphadenopathy:    She has no cervical adenopathy.  Neurological: She is alert and oriented to person, place, and time. She has normal reflexes. No cranial nerve deficit.  Skin: Skin is warm and dry. No rash noted.  Psychiatric: She has a normal mood and affect.          Assessment & Plan:

## 2017-01-17 NOTE — Telephone Encounter (Signed)
Spoken to patient. She stated that she wondering if Suzanne Chandler can prescribed estrace 1 mg (estradiol vaginal cream/gel, USP, 0.01%). She usually get this compounded.  Patient had been usually this for a since the end of 2015 since her hysterectomy and cystocele.Patient was told to use this to keep tissue vaginal are intact.

## 2017-01-18 NOTE — Telephone Encounter (Signed)
Tried to call patient and unable to leave message since voicemail box is full.

## 2017-01-18 NOTE — Telephone Encounter (Signed)
Patient returned Chan's call.  Patient can be reached at [phone].  She'll try to clear her voice mail.

## 2017-01-18 NOTE — Telephone Encounter (Signed)
It's not something that I typically initiate. When's the last time she had this medication? Does she have a GYN that she sees, this would be for them? I wouldn't recommend it if she's not having any symptoms.

## 2017-01-19 ENCOUNTER — Encounter: Payer: Self-pay | Admitting: Gastroenterology

## 2017-01-22 NOTE — Telephone Encounter (Signed)
Spoken and notified patient of Kate's comments. Patient stated that the last time the medication was prescribed last year. She does not use the cream every day and it is used for bladder tissue.  Patient stated that if Anda Kraft cannot prescribed it then please send a referral to GYN.

## 2017-01-22 NOTE — Telephone Encounter (Signed)
Message left for patient to return my call on 01/19/2017

## 2017-01-23 MED ORDER — ESTRADIOL 0.1 MG/GM VA CREA: TOPICAL_CREAM | VAGINAL | 0 refills | Status: DC

## 2017-01-23 NOTE — Telephone Encounter (Signed)
Uses cream 7-10 days per month for symptoms of vaginal dryness. Has been using this for several years with improvement of vaginal dryness secondary to atrophy.  She would like estrace cream 1 mg sent to her pharmacy. This has been done.

## 2017-01-30 DIAGNOSIS — F331 Major depressive disorder, recurrent, moderate: Secondary | ICD-10-CM | POA: Diagnosis not present

## 2017-02-13 DIAGNOSIS — F331 Major depressive disorder, recurrent, moderate: Secondary | ICD-10-CM | POA: Diagnosis not present

## 2017-02-23 ENCOUNTER — Ambulatory Visit (AMBULATORY_SURGERY_CENTER): Payer: Self-pay

## 2017-02-23 VITALS — Ht 66.0 in | Wt 168.2 lb

## 2017-02-23 DIAGNOSIS — Z1211 Encounter for screening for malignant neoplasm of colon: Secondary | ICD-10-CM

## 2017-02-23 MED ORDER — NA SULFATE-K SULFATE-MG SULF 17.5-3.13-1.6 GM/177ML PO SOLN: 1.0000 | Freq: Once | ORAL | 0 refills | Status: AC

## 2017-02-23 NOTE — Progress Notes (Signed)
Denies allergies to eggs or soy products. Denies complication of anesthesia or sedation. Denies use of weight loss medication. Denies use of O2.   Emmi instructions given for colonoscopy.  

## 2017-02-26 ENCOUNTER — Telehealth: Payer: Self-pay | Admitting: Primary Care

## 2017-02-26 ENCOUNTER — Telehealth: Payer: Self-pay | Admitting: Gastroenterology

## 2017-02-26 ENCOUNTER — Encounter: Payer: Self-pay | Admitting: Gastroenterology

## 2017-02-26 NOTE — Telephone Encounter (Signed)
Patient stated that she have already called GI and they have addressed this issue.

## 2017-02-26 NOTE — Telephone Encounter (Signed)
Spoke with patient. Explained to patient that she is able to take medications before colonoscopy as long as it is before NPO time. Patient understands.

## 2017-02-26 NOTE — Telephone Encounter (Signed)
Have her call GI and see if it is okay to take a 14 day course of Prilosec OTC

## 2017-02-26 NOTE — Telephone Encounter (Signed)
Patient Name: Suzanne Chandler  DOB: [birth year]    Initial Comment Caller says , has questions about colonoscopy treatment, she gets a gas 'bubble' which she is able to usually take care of with anti-gas gels. It is in her abd above her naval. She has questions about her colonoscopy and the air going into her bowels ?    Nurse Assessment  Nurse: Raphael Gibney, RN, Vanita Ingles Date/Time (Eastern Time): 02/26/2017 12:13:28 PM  Confirm and document reason for call. If symptomatic, describe symptoms. ---Caller states she is scheduled for colonoscopy on June 3. Had her preview visit on May 18. has "gas bubble" above her navel. She takes simethicone medication which helps. She burps and then everything is ok. She had attack of upper GI pain on Friday which went into her chest and around her back. She vomited x 2. Took 4 Simethicone soft gels and 4 simethicone mint chewables on Friday which finally helped after about 30 min. Took glycerin suppository on Friday and Saturday night and had a BM. Feels bloated but no pain.  Does the patient have any new or worsening symptoms? ---Yes  Will a triage be completed? ---Yes  Related visit to physician within the last 2 weeks? ---No  Does the PT have any chronic conditions? (i.e. diabetes, asthma, etc.) ---No  Is this a behavioral health or substance abuse call? ---No     Guidelines    Guideline Title Affirmed Question Affirmed Notes  Abdominal Pain - Upper Abdominal pain    Final Disposition User   [facility], RN, Vanita Ingles    Comments  pt wants to know about taking simethicone prior to her colonoscopy. Advised caller to call her GI doctor. Verbalized understanding.   Disagree/Comply: Comply

## 2017-02-26 NOTE — Telephone Encounter (Signed)
Pt states that she had a gastric episode the day of her pre visit on 5.18.18 and that was the first one since around 2009, but pt is worried she might have one during her procedure on 6.1.18 and would like to know should she be worried?

## 2017-03-09 ENCOUNTER — Ambulatory Visit (AMBULATORY_SURGERY_CENTER): Payer: BLUE CROSS/BLUE SHIELD | Admitting: Gastroenterology

## 2017-03-09 ENCOUNTER — Encounter: Payer: Self-pay | Admitting: Gastroenterology

## 2017-03-09 VITALS — BP 105/65 | HR 65 | Temp 97.8°F | Resp 14 | Ht 66.0 in | Wt 168.0 lb

## 2017-03-09 DIAGNOSIS — Z1212 Encounter for screening for malignant neoplasm of rectum: Secondary | ICD-10-CM

## 2017-03-09 DIAGNOSIS — D122 Benign neoplasm of ascending colon: Secondary | ICD-10-CM | POA: Diagnosis not present

## 2017-03-09 DIAGNOSIS — Z1211 Encounter for screening for malignant neoplasm of colon: Secondary | ICD-10-CM

## 2017-03-09 DIAGNOSIS — D125 Benign neoplasm of sigmoid colon: Secondary | ICD-10-CM

## 2017-03-09 MED ORDER — OMEPRAZOLE 20 MG PO CPDR: 20.0000 mg | DELAYED_RELEASE_CAPSULE | Freq: Every day | ORAL | 3 refills | Status: DC

## 2017-03-09 MED ORDER — SODIUM CHLORIDE 0.9 % IV SOLN: 500.0000 mL | INTRAVENOUS | Status: DC

## 2017-03-09 NOTE — Op Note (Signed)
[facility] Patient Name: Suzanne Chandler Procedure Date: 03/09/2017 10:45 AM MRN: [id number] Endoscopist: Mauri Pole , MD Age: 56 Referring MD:  Date of Birth: May 18, 1961 Gender: Female Account #: [id number] Procedure:                Colonoscopy Indications:              Screening for colorectal malignant neoplasm, This                            is the patient's first colonoscopy Medicines:                Monitored Anesthesia Care Procedure:                Pre-Anesthesia Assessment:                           - Prior to the procedure, a History and Physical                            was performed, and patient medications and                            allergies were reviewed. The patient's tolerance of                            previous anesthesia was also reviewed. The risks                            and benefits of the procedure and the sedation                            options and risks were discussed with the patient.                            All questions were answered, and informed consent                            was obtained. Prior Anticoagulants: The patient has                            taken no previous anticoagulant or antiplatelet                            agents. ASA Grade Assessment: II - A patient with                            mild systemic disease. After reviewing the risks                            and benefits, the patient was deemed in                            satisfactory condition to undergo the procedure.  After obtaining informed consent, the colonoscope                            was passed under direct vision. Throughout the                            procedure, the patient's blood pressure, pulse, and                            oxygen saturations were monitored continuously. The                            Colonoscope was introduced through the anus and                            advanced to the the  cecum, identified by                            appendiceal orifice and ileocecal valve. The                            colonoscopy was performed without difficulty. The                            patient tolerated the procedure well. The quality                            of the bowel preparation was excellent. The                            ileocecal valve, appendiceal orifice, and rectum                            were photographed. Scope In: 10:58:59 AM Scope Out: 11:15:05 AM Scope Withdrawal Time: 0 hours 11 minutes 48 seconds  Total Procedure Duration: 0 hours 16 minutes 6 seconds  Findings:                 The perianal and digital rectal examinations were                            normal.                           Two sessile polyps were found in the sigmoid colon                            and ascending colon. The polyps were 5 to 8 mm in                            size. These polyps were removed with a cold snare.                            Resection and retrieval were complete.  A 2 mm polyp was found in the sigmoid colon. The                            polyp was sessile. The polyp was removed with a                            cold biopsy forceps. Resection and retrieval were                            complete.                           A 9 mm polyp was found in the sigmoid colon. The                            polyp was semi-pedunculated. The polyp was removed                            with a hot snare. Resection and retrieval were                            complete.                           Non-bleeding internal hemorrhoids were found during                            retroflexion. The hemorrhoids were small.                           A few small-mouthed diverticula were found in the                            sigmoid colon. Complications:            No immediate complications. Estimated Blood Loss:     Estimated blood loss was  minimal. Impression:               - Two 5 to 8 mm polyps in the sigmoid colon and in                            the ascending colon, removed with a cold snare.                            Resected and retrieved.                           - One 2 mm polyp in the sigmoid colon, removed with                            a cold biopsy forceps. Resected and retrieved.                           - One 9 mm polyp in the sigmoid colon, removed with  a hot snare. Resected and retrieved.                           - Non-bleeding internal hemorrhoids.                           - Diverticulosis in the sigmoid colon. Recommendation:           - Patient has a contact number available for                            emergencies. The signs and symptoms of potential                            delayed complications were discussed with the                            patient. Return to normal activities tomorrow.                            Written discharge instructions were provided to the                            patient.                           - Resume previous diet.                           - Continue present medications.                           - Await pathology results.                           - Repeat colonoscopy in 3 - 5 years for                            surveillance based on pathology results. Mauri Pole, MD 03/09/2017 11:19:47 AM This report has been signed electronically.

## 2017-03-09 NOTE — Progress Notes (Signed)
Pt states she has gastric bubble- it starts above umbilicus, moves all the way up to the shoulder- she uses gas x chews and that helps normally- last episode lasted longer and the gas x didn't help but pt concerned that this reoccurs- this has been happening for years - sometimes vomits if takes 8-12 tums or gas x   Pt's states no medical or surgical changes since previsit or office visit.

## 2017-03-09 NOTE — Progress Notes (Signed)
Called to room to assist during endoscopic procedure.  Patient ID and intended procedure confirmed with present staff. Received instructions for my participation in the procedure from the performing physician.  

## 2017-03-09 NOTE — Patient Instructions (Signed)
Handout given on polyps  YOU HAD AN ENDOSCOPIC PROCEDURE TODAY: Refer to the procedure report and other information in the discharge instructions given to you for any specific questions about what was found during the examination. If this information does not answer your questions, please call [place] office at [phone] to clarify.   YOU SHOULD EXPECT: Some feelings of bloating in the abdomen. Passage of more gas than usual. Walking can help get rid of the air that was put into your GI tract during the procedure and reduce the bloating. If you had a lower endoscopy (such as a colonoscopy or flexible sigmoidoscopy) you may notice spotting of blood in your stool or on the toilet paper. Some abdominal soreness may be present for a day or two, also.  DIET: Your first meal following the procedure should be a light meal and then it is ok to progress to your normal diet. A half-sandwich or bowl of soup is an example of a good first meal. Heavy or fried foods are harder to digest and may make you feel nauseous or bloated. Drink plenty of fluids but you should avoid alcoholic beverages for 24 hours. If you had a esophageal dilation, please see attached instructions for diet.    ACTIVITY: Your care partner should take you home directly after the procedure. You should plan to take it easy, moving slowly for the rest of the day. You can resume normal activity the day after the procedure however YOU SHOULD NOT DRIVE, use power tools, machinery or perform tasks that involve climbing or major physical exertion for 24 hours (because of the sedation medicines used during the test).   SYMPTOMS TO REPORT IMMEDIATELY: A gastroenterologist can be reached at any hour. Please call [phone]  for any of the following symptoms:  Following lower endoscopy (colonoscopy, flexible sigmoidoscopy) Excessive amounts of blood in the stool  Significant tenderness, worsening of abdominal pains  Swelling of the abdomen that is  new, acute  Fever of 100 or higher    FOLLOW UP:  If any biopsies were taken you will be contacted by phone or by letter within the next 1-3 weeks. Call [phone]  if you have not heard about the biopsies in 3 weeks.  Please also call with any specific questions about appointments or follow up tests.  

## 2017-03-09 NOTE — Progress Notes (Signed)
To recovery vss report to [employer]

## 2017-03-12 ENCOUNTER — Telehealth: Payer: Self-pay | Admitting: *Deleted

## 2017-03-12 NOTE — Telephone Encounter (Signed)
  Follow up Call-  Call back number 03/09/2017  Post procedure Call Back phone  # [phone]  Permission to leave phone message Yes  Some recent data might be hidden     No answer, left message

## 2017-03-12 NOTE — Telephone Encounter (Signed)
  Follow up Call-  Call back number 03/09/2017  Post procedure Call Back phone  # [phone]  Permission to leave phone message Yes  Some recent data might be hidden     No answer, left message.

## 2017-03-13 DIAGNOSIS — F331 Major depressive disorder, recurrent, moderate: Secondary | ICD-10-CM | POA: Diagnosis not present

## 2017-03-14 ENCOUNTER — Encounter: Payer: Self-pay | Admitting: Gastroenterology

## 2017-03-20 ENCOUNTER — Telehealth: Payer: Self-pay | Admitting: Gastroenterology

## 2017-03-20 NOTE — Telephone Encounter (Signed)
Patient is having bowel movements maybe twice a day. She feels they are of a smaller amount than she had before her colonoscopy. She denies pain, hard bowel movements or any blood. She will start including fresh vegtables and fruit in her diet. Call us back if she acutely worsens or fails to improve.

## 2017-04-17 ENCOUNTER — Other Ambulatory Visit (INDEPENDENT_AMBULATORY_CARE_PROVIDER_SITE_OTHER): Payer: BLUE CROSS/BLUE SHIELD

## 2017-04-17 DIAGNOSIS — E559 Vitamin D deficiency, unspecified: Secondary | ICD-10-CM | POA: Diagnosis not present

## 2017-04-17 LAB — VITAMIN D 25 HYDROXY (VIT D DEFICIENCY, FRACTURES): VITD: 23.22 ng/mL — ABNORMAL LOW (ref 30.00–100.00)

## 2017-04-24 ENCOUNTER — Encounter: Payer: Self-pay | Admitting: Primary Care

## 2017-04-24 ENCOUNTER — Ambulatory Visit (INDEPENDENT_AMBULATORY_CARE_PROVIDER_SITE_OTHER): Payer: BLUE CROSS/BLUE SHIELD | Admitting: Primary Care

## 2017-04-24 VITALS — BP 104/72 | HR 58 | Temp 97.8°F | Ht 66.0 in | Wt 162.8 lb

## 2017-04-24 DIAGNOSIS — E2839 Other primary ovarian failure: Secondary | ICD-10-CM

## 2017-04-24 DIAGNOSIS — F331 Major depressive disorder, recurrent, moderate: Secondary | ICD-10-CM | POA: Diagnosis not present

## 2017-04-24 DIAGNOSIS — E559 Vitamin D deficiency, unspecified: Secondary | ICD-10-CM

## 2017-04-24 MED ORDER — VITAMIN D (ERGOCALCIFEROL) 1.25 MG (50000 UNIT) PO CAPS: ORAL_CAPSULE | ORAL | 0 refills | Status: DC

## 2017-04-24 NOTE — Assessment & Plan Note (Signed)
Not taking OTC vitamin d consistently. Rx for Vitamin D 50,000 units sent to pharmacy for once weekly consumption. Check Bone Density given history of tobacco abuse.

## 2017-04-24 NOTE — Progress Notes (Signed)
Subjective:    Patient ID: Suzanne Chandler, female    DOB: 1961/07/16, 56 y.o.   MRN: [id number]  HPI  Suzanne Chandler is a 56 year old female who presents today with multiple complaints.  1) Shoulder Pain: Located to left side. Slipped and fell onto left shoulder in the ice injuring her left shoulder one winter, this occurred five years ago. She was told that this was injured moderately, no surgical intervention.   Over the last 2 weeks she's noticed left posterior shoulder pain, decrease in ROM with lateral and forward abduction. She works at [employer], lifting heavy boxes, putting up supplies.   She denies numbness/tingling, weakness. The pain will radiate from her left shoulder to her left elbow.   2) Vitamin D: Level of 23 on recheck in mid July, previous reading of 25. She's been taking 5000 units of vitamin D 3-4 times weekly on average as she forgets to take this regularly. She was once told that she has early signs of osteoporosis. She is a current smoker. No recent bone density testing.  Review of Systems  Respiratory: Negative for shortness of breath.   Cardiovascular: Negative for chest pain.  Musculoskeletal: Positive for arthralgias.  Neurological: Negative for weakness and numbness.       Past Medical History:  Diagnosis Date  . Anxiety   . Anxiety and depression   . Arthritis   . Bipolar disorder ([place])   . Borderline diabetes   . Depression   . Female bladder prolapse   . Hyperlipemia   . Urinary incontinence   . Urinary tract infection      Social History   Social History  . Marital status: Widowed    Spouse name: N/A  . Number of children: N/A  . Years of education: N/A   Occupational History  . Not on file.   Social History Main Topics  . Smoking status: Current Every Day Smoker    Packs/day: 1.00    Years: 33.00    Types: Cigarettes  . Smokeless tobacco: Never Used  . Alcohol use No  . Drug use: No  .  Sexual activity: Not on file   Other Topics Concern  . Not on file   Social History Narrative   Widow   Works in Therapist, art.   Highest level of education is GED.   Enjoys spending time with family.    Past Surgical History:  Procedure Laterality Date  . ABDOMINAL HYSTERECTOMY  2014  . BREAST EXCISIONAL BIOPSY Right 05/2013   neg  . CHOLECYSTECTOMY  1985  . CYSTOCELE REPAIR  2015  . TUBAL LIGATION  1987    Family History  Problem Relation Age of Onset  . Ovarian cancer Sister   . Arthritis Sister   . COPD Sister   . Anxiety disorder Sister   . Hyperlipidemia Mother   . Hypertension Mother   . Diabetes Mother   . Depression Mother   . Heart disease Father   . Stroke Father   . Heart attack Father   . Depression Brother   . Anxiety disorder Brother   . Colon cancer Neg Hx   . Esophageal cancer Neg Hx   . Rectal cancer Neg Hx   . Stomach cancer Neg Hx     No Known Allergies  Current Outpatient Prescriptions on File Prior to Visit  Medication Sig Dispense Refill  . Ascorbic Acid (VITAMIN C) 1000 MG tablet Take 1,000 mg by  mouth daily.    . calcium carbonate 1250 MG capsule Take 1,250 mg by mouth 2 (two) times daily with a meal.    . estradiol (ESTRACE VAGINAL) 0.1 MG/GM vaginal cream Apply vaginally as needed for vaginal dryness. 42.5 g 0  . NONFORMULARY OR COMPOUNDED ITEM Take 1 tablet by mouth daily.    . Omega-3 Fatty Acids (FISH OIL) 1000 MG CAPS Take 1,000 mg by mouth 3 (three) times daily.    . Plant Sterols and Stanols (CHOLEST OFF PO) Take 2 capsules by mouth daily.     Marland Kitchen VIIBRYD 20 MG TABS Take 10 mg by mouth every other day.   0  . clonazePAM (KLONOPIN) 0.5 MG tablet Take 0.25-0.5 mg by mouth 2 (two) times daily as needed for anxiety.     Marland Kitchen QUEtiapine (SEROQUEL) 25 MG tablet Take 12.5 mg by mouth daily.   0   Current Facility-Administered Medications on File Prior to Visit  Medication Dose Route Frequency Provider Last Rate Last Dose  . 0.9 %   sodium chloride infusion  500 mL Intravenous Continuous Nandigam, Kavitha V, MD      . betamethasone acetate-betamethasone sodium phosphate (CELESTONE) injection 12 mg  12 mg Intramuscular Once Daylene Katayama M, DPM      . betamethasone acetate-betamethasone sodium phosphate (CELESTONE) injection 12 mg  12 mg Intramuscular Once Evans, Brent M, DPM        BP 104/72   Pulse (!) 58   Temp 97.8 F (36.6 C) (Oral)   Ht 5\' 6"  (1.676 m)   Wt 162 lb 12.8 oz (73.8 kg)   SpO2 95%   BMI 26.28 kg/m    Objective:   Physical Exam  Constitutional: She appears well-nourished.  Neck: Neck supple.  Cardiovascular: Normal rate and regular rhythm.   Pulmonary/Chest: Effort normal and breath sounds normal.  Musculoskeletal:       Left shoulder: She exhibits decreased range of motion and pain. She exhibits no tenderness and no spasm.  Decrease in ROM with forward and lateral abduction. No crepitus, deformity, swelling, erythema.  Skin: Skin is warm and dry.          Assessment & Plan:  Acute Shoulder Pain:  Located to left shoulder x 10 days. History of injury 5 years ago. 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 Recommend she stretch, reduce lifting, reduce pushing/pulling. Will have her start Ibuprofen TID. She declines xray today which is reasonable. If no improvement in 1 week recommend xray, then consider PT.  Sheral Flow, NP

## 2017-04-24 NOTE — Patient Instructions (Signed)
Start vitamin D 50,000 unit capsules once weekly for 12 weeks for low vitamin D. Do this for a total of 12 weeks. Continue calcium.  Start Ibuprofen 600 mg three times daily for 1-2 weeks for shoulder pain.  Please call me in 1 week if no improvement to your shoulder, we will do an xray.  It was a pleasure to see you today!

## 2017-05-10 ENCOUNTER — Ambulatory Visit
Admission: RE | Admit: 2017-05-10 | Discharge: 2017-05-10 | Disposition: A | Discharge status: Home / Self Care | Payer: BLUE CROSS/BLUE SHIELD | Source: Ambulatory Visit | Attending: Primary Care | Admitting: Primary Care

## 2017-05-10 DIAGNOSIS — E559 Vitamin D deficiency, unspecified: Secondary | ICD-10-CM | POA: Diagnosis not present

## 2017-05-10 DIAGNOSIS — Z13820 Encounter for screening for osteoporosis: Secondary | ICD-10-CM | POA: Diagnosis not present

## 2017-05-10 DIAGNOSIS — E2839 Other primary ovarian failure: Secondary | ICD-10-CM

## 2017-05-10 DIAGNOSIS — M8588 Other specified disorders of bone density and structure, other site: Secondary | ICD-10-CM | POA: Diagnosis not present

## 2017-05-10 DIAGNOSIS — M85852 Other specified disorders of bone density and structure, left thigh: Secondary | ICD-10-CM | POA: Insufficient documentation

## 2017-07-03 ENCOUNTER — Encounter: Payer: Self-pay | Admitting: Primary Care

## 2017-07-03 ENCOUNTER — Ambulatory Visit (INDEPENDENT_AMBULATORY_CARE_PROVIDER_SITE_OTHER): Payer: BLUE CROSS/BLUE SHIELD | Admitting: Primary Care

## 2017-07-03 VITALS — BP 104/70 | HR 65 | Temp 98.1°F | Ht 66.0 in | Wt 165.8 lb

## 2017-07-03 DIAGNOSIS — R8290 Unspecified abnormal findings in urine: Secondary | ICD-10-CM | POA: Diagnosis not present

## 2017-07-03 LAB — POC URINALSYSI DIPSTICK (AUTOMATED)
BILIRUBIN UA: NEGATIVE
GLUCOSE UA: NEGATIVE
Ketones, UA: NEGATIVE
Leukocytes, UA: NEGATIVE
Nitrite, UA: NEGATIVE
Protein, UA: NEGATIVE
RBC UA: NEGATIVE
Urobilinogen, UA: 0.2 E.U./dL
pH, UA: 6 (ref 5.0–8.0)

## 2017-07-03 NOTE — Progress Notes (Signed)
Subjective:    Patient ID: Suzanne Chandler, female    DOB: 11-Sep-1961, 56 y.o.   MRN: [id number]  HPI  Ms. Maultsby is a 56 year old female with a history of hysterectomy who presents today with a chief complaint of foul smelling urine. She also reports urinary urgency and frequency. She denies hematuria, dysuria, fevers, abdominal pain, vaginal discharge/itching. Her symptoms began 2 weeks ago. She's not taken anything OTC.   Review of Systems  Constitutional: Negative for fever.  Gastrointestinal: Negative for abdominal pain and nausea.  Genitourinary: Positive for frequency and urgency. Negative for dysuria and vaginal discharge.       Past Medical History:  Diagnosis Date  . Anxiety   . Anxiety and depression   . Arthritis   . Bipolar disorder ([place])   . Borderline diabetes   . Depression   . Female bladder prolapse   . Hyperlipemia   . Urinary incontinence   . Urinary tract infection      Social History   Social History  . Marital status: Widowed    Spouse name: N/A  . Number of children: N/A  . Years of education: N/A   Occupational History  . Not on file.   Social History Main Topics  . Smoking status: Current Every Day Smoker    Packs/day: 1.00    Years: 33.00    Types: Cigarettes  . Smokeless tobacco: Never Used  . Alcohol use No  . Drug use: No  . Sexual activity: Not on file   Other Topics Concern  . Not on file   Social History Narrative   Widow   Works in Therapist, art.   Highest level of education is GED.   Enjoys spending time with family.    Past Surgical History:  Procedure Laterality Date  . ABDOMINAL HYSTERECTOMY  2014  . BREAST EXCISIONAL BIOPSY Right 05/2013   neg  . CHOLECYSTECTOMY  1985  . CYSTOCELE REPAIR  2015  . TUBAL LIGATION  1987    Family History  Problem Relation Age of Onset  . Ovarian cancer Sister   . Arthritis Sister   . COPD Sister   . Anxiety disorder Sister   . Hyperlipidemia Mother   .  Hypertension Mother   . Diabetes Mother   . Depression Mother   . Heart disease Father   . Stroke Father   . Heart attack Father   . Depression Brother   . Anxiety disorder Brother   . Colon cancer Neg Hx   . Esophageal cancer Neg Hx   . Rectal cancer Neg Hx   . Stomach cancer Neg Hx     No Known Allergies  Current Outpatient Prescriptions on File Prior to Visit  Medication Sig Dispense Refill  . Ascorbic Acid (VITAMIN C) 1000 MG tablet Take 1,000 mg by mouth daily.    . calcium carbonate 1250 MG capsule Take 1,250 mg by mouth 2 (two) times daily with a meal.    . Cholecalciferol (VITAMIN D PO) Take 5,000 Units by mouth daily.    . clonazePAM (KLONOPIN) 0.5 MG tablet Take 0.25-0.5 mg by mouth 2 (two) times daily as needed for anxiety.     Marland Kitchen estradiol (ESTRACE VAGINAL) 0.1 MG/GM vaginal cream Apply vaginally as needed for vaginal dryness. 42.5 g 0  . lamoTRIgine (LAMICTAL) 100 MG tablet Take 100 mg by mouth daily.   2  . NONFORMULARY OR COMPOUNDED ITEM Take 1 tablet by mouth daily.    Marland Kitchen  Omega-3 Fatty Acids (FISH OIL) 1000 MG CAPS Take 1,000 mg by mouth 3 (three) times daily.    . Plant Sterols and Stanols (CHOLEST OFF PO) Take 2 capsules by mouth daily.     . QUEtiapine (SEROQUEL) 25 MG tablet Take 12.5 mg by mouth daily.   0  . VIIBRYD 20 MG TABS Take 10 mg by mouth every other day.   0  . Vitamin D, Ergocalciferol, (DRISDOL) 50000 units CAPS capsule Take 1 capsule once weekly for 12 weeks. 12 capsule 0   Current Facility-Administered Medications on File Prior to Visit  Medication Dose Route Frequency Provider Last Rate Last Dose  . 0.9 %  sodium chloride infusion  500 mL Intravenous Continuous Nandigam, Kavitha V, MD      . betamethasone acetate-betamethasone sodium phosphate (CELESTONE) injection 12 mg  12 mg Intramuscular Once Daylene Katayama M, DPM      . betamethasone acetate-betamethasone sodium phosphate (CELESTONE) injection 12 mg  12 mg Intramuscular Once Evans, Brent M, DPM         BP 104/70   Pulse 65   Temp 98.1 F (36.7 C) (Oral)   Ht 5\' 6"  (1.676 m)   Wt 165 lb 12.8 oz (75.2 kg)   SpO2 98%   BMI 26.76 kg/m    Objective:   Physical Exam  Constitutional: She appears well-nourished.  Neck: Neck supple.  Cardiovascular: Normal rate and regular rhythm.   Pulmonary/Chest: Effort normal and breath sounds normal.  Abdominal: Soft. Bowel sounds are normal. There is no tenderness. There is no CVA tenderness.  Skin: Skin is warm and dry.          Assessment & Plan:  Foul Smelling Urine:  Present for the past 2 weeks, also with urgency with frequency. UA today: Negative. Exam today unremarkable, no evidence of infection or renal stone. Discussed to stay hydrated with water. Try AZO for potential interstitial cystitis.  She will update if symptoms progress.  Sheral Flow, NP

## 2017-07-03 NOTE — Patient Instructions (Addendum)
You do not have a urinary tract infection.  Ensure you are staying hydrated with water and rest. Ensure you are consuming 64 ounces of water daily.  You may try AZO tablets for your symptoms.  Please notify me if you develop fevers, blood in your urine, mid back pain that radiates around to your groin.  It was a pleasure to see you today!

## 2017-07-03 NOTE — Addendum Note (Signed)
Addended by: Jacqualin Combes on: 07/03/2017 12:32 PM   Modules accepted: Orders

## 2017-07-05 ENCOUNTER — Other Ambulatory Visit: Payer: Self-pay | Admitting: Primary Care

## 2017-07-05 DIAGNOSIS — R7303 Prediabetes: Secondary | ICD-10-CM

## 2017-07-05 DIAGNOSIS — E559 Vitamin D deficiency, unspecified: Secondary | ICD-10-CM

## 2017-07-17 ENCOUNTER — Other Ambulatory Visit (INDEPENDENT_AMBULATORY_CARE_PROVIDER_SITE_OTHER): Payer: BLUE CROSS/BLUE SHIELD

## 2017-07-17 DIAGNOSIS — E559 Vitamin D deficiency, unspecified: Secondary | ICD-10-CM | POA: Diagnosis not present

## 2017-07-17 DIAGNOSIS — R7303 Prediabetes: Secondary | ICD-10-CM

## 2017-07-17 LAB — HEMOGLOBIN A1C: HEMOGLOBIN A1C: 5.9 % (ref 4.6–6.5)

## 2017-07-17 LAB — VITAMIN D 25 HYDROXY (VIT D DEFICIENCY, FRACTURES): VITD: 44.02 ng/mL (ref 30.00–100.00)

## 2017-07-31 DIAGNOSIS — F331 Major depressive disorder, recurrent, moderate: Secondary | ICD-10-CM | POA: Diagnosis not present

## 2017-10-24 ENCOUNTER — Ambulatory Visit: Payer: BLUE CROSS/BLUE SHIELD | Admitting: Family Medicine

## 2017-10-25 ENCOUNTER — Other Ambulatory Visit: Payer: Self-pay | Admitting: *Deleted

## 2017-10-26 ENCOUNTER — Ambulatory Visit: Payer: Self-pay | Admitting: Primary Care

## 2017-10-26 DIAGNOSIS — Z0289 Encounter for other administrative examinations: Secondary | ICD-10-CM

## 2017-11-07 DIAGNOSIS — M7542 Impingement syndrome of left shoulder: Secondary | ICD-10-CM | POA: Diagnosis not present

## 2017-11-15 DIAGNOSIS — F331 Major depressive disorder, recurrent, moderate: Secondary | ICD-10-CM | POA: Diagnosis not present

## 2018-01-01 DIAGNOSIS — M7542 Impingement syndrome of left shoulder: Secondary | ICD-10-CM | POA: Diagnosis not present

## 2018-01-04 ENCOUNTER — Other Ambulatory Visit: Payer: Self-pay | Admitting: Physician Assistant

## 2018-01-04 ENCOUNTER — Ambulatory Visit: Payer: Self-pay

## 2018-01-04 DIAGNOSIS — M7542 Impingement syndrome of left shoulder: Secondary | ICD-10-CM

## 2018-01-04 NOTE — Telephone Encounter (Signed)
Pt  Has   Symptoms  Of  uti  Frequency   Discomfort  When  Urinates .  No  Availability  At  [facility]  For tomorow  At the  [place]     Reason for Disposition . Urinating more frequently than usual (i.e., frequency)  Answer Assessment - Initial Assessment Questions 1. SYMPTOM: "What's the main symptom you're concerned about?" (e.g., frequency, incontinence)     Frequent  Urination   Feels  A  Pinch   2. ONSET: "When did the  ________  start?"       3   Weeks   Ago   Worse  Over the  Last  Week    3. PAIN: "Is there any pain?" If so, ask: "How bad is it?" (Scale: 1-10; mild, moderate, severe)      Bilateral  Flank    Mild     4. CAUSE: "What do you think is causing the symptoms?"     Poss  Uti  5. OTHER SYMPTOMS: "Do you have any other symptoms?" (e.g., fever, flank pain, blood in urine, pain with urination)     Foul  Odor   Cloudy  At   Times   Feels   Heavy  All  The  Time   6. PREGNANCY: "Is there any chance you are pregnant?" "When was your last menstrual period?"        no  Protocols used: URINARY [facility]

## 2018-01-05 ENCOUNTER — Other Ambulatory Visit (HOSPITAL_COMMUNITY)
Admit: 2018-01-05 | Discharge: 2018-01-05 | Disposition: A | Discharge status: Home / Self Care | Payer: BLUE CROSS/BLUE SHIELD | Source: Other Acute Inpatient Hospital | Attending: Family Medicine | Admitting: Family Medicine

## 2018-01-05 ENCOUNTER — Encounter: Payer: Self-pay | Admitting: Family Medicine

## 2018-01-05 ENCOUNTER — Ambulatory Visit: Payer: BLUE CROSS/BLUE SHIELD | Admitting: Family Medicine

## 2018-01-05 VITALS — BP 114/70 | HR 61 | Temp 98.6°F | Wt 166.0 lb

## 2018-01-05 DIAGNOSIS — R109 Unspecified abdominal pain: Secondary | ICD-10-CM | POA: Diagnosis not present

## 2018-01-05 DIAGNOSIS — N3000 Acute cystitis without hematuria: Secondary | ICD-10-CM

## 2018-01-05 DIAGNOSIS — R350 Frequency of micturition: Secondary | ICD-10-CM | POA: Insufficient documentation

## 2018-01-05 LAB — POCT URINALYSIS DIPSTICK
Bilirubin, UA: NEGATIVE
Glucose, UA: NEGATIVE
KETONES UA: NEGATIVE
LEUKOCYTES UA: NEGATIVE
Nitrite, UA: NEGATIVE
PROTEIN UA: NEGATIVE
UROBILINOGEN UA: 0.2 U/dL
pH, UA: 6 (ref 5.0–8.0)

## 2018-01-05 NOTE — Progress Notes (Addendum)
2-3 day ho urinary frequency, malodor with minimal dysuria. No f/c. This has been occurring every 2-28mos sp hysterectomy and bladder tac. Has E2 cream but using sporadically.   Subjective:  Patient ID: Suzanne Chandler, female    DOB: 1961-01-15  Age: 57 y.o. MRN: [id number]  CC: Urinary Tract Infection (abd pain, odor, urine frequency)   HPI Suzanne Chandler presents for 2-3 d ho urinary frequency, minimal dysurary and urine malodor. Has e2 cream but uses intermittently. Having thes episodes every 3-4 mos with negative cultures. This, sp hysterectomy with bladder tuck.   Outpatient Medications Prior to Visit  Medication Sig Dispense Refill  . Ascorbic Acid (VITAMIN C) 1000 MG tablet Take 1,000 mg by mouth daily.    . calcium carbonate 1250 MG capsule Take 1,250 mg by mouth 2 (two) times daily with a meal.    . Cholecalciferol (VITAMIN D PO) Take 5,000 Units by mouth daily.    . clonazePAM (KLONOPIN) 0.5 MG tablet Take 0.25-0.5 mg by mouth 2 (two) times daily as needed for anxiety.     Marland Kitchen estradiol (ESTRACE VAGINAL) 0.1 MG/GM vaginal cream Apply vaginally as needed for vaginal dryness. 42.5 g 0  . lamoTRIgine (LAMICTAL) 100 MG tablet Take 100 mg by mouth daily.   2  . NONFORMULARY OR COMPOUNDED ITEM Take 1 tablet by mouth daily.    . Omega-3 Fatty Acids (FISH OIL) 1000 MG CAPS Take 1,000 mg by mouth 3 (three) times daily.    . Plant Sterols and Stanols (CHOLEST OFF PO) Take 2 capsules by mouth daily.     . QUEtiapine (SEROQUEL) 25 MG tablet Take 12.5 mg by mouth daily.   0  . VIIBRYD 20 MG TABS Take 10 mg by mouth every other day.   0  . Vitamin D, Ergocalciferol, (DRISDOL) 50000 units CAPS capsule Take 1 capsule once weekly for 12 weeks. 12 capsule 0  . chlorhexidine (PERIDEX) 0.12 % solution   0   Facility-Administered Medications Prior to Visit  Medication Dose Route Frequency Provider Last Rate Last Dose  . 0.9 %  sodium chloride infusion  500 mL Intravenous Continuous  Nandigam, Kavitha V, MD      . betamethasone acetate-betamethasone sodium phosphate (CELESTONE) injection 12 mg  12 mg Intramuscular Once Daylene Katayama M, DPM      . betamethasone acetate-betamethasone sodium phosphate (CELESTONE) injection 12 mg  12 mg Intramuscular Once Daylene Katayama M, DPM        ROS Review of Systems  Constitutional: Negative for chills, fatigue, fever and unexpected weight change.  HENT: Negative.   Respiratory: Negative.   Cardiovascular: Negative.   Gastrointestinal: Negative.   Endocrine: Negative for polyphagia and polyuria.  Genitourinary: Positive for frequency. Negative for difficulty urinating, dysuria, flank pain, hematuria, pelvic pain, urgency and vaginal discharge.  Hematological: Does not bruise/bleed easily.  Psychiatric/Behavioral: Negative.     Objective:  BP 114/70 (BP Location: Left Arm, Patient Position: Sitting, Cuff Size: Normal)   Pulse 61   Temp 98.6 F (37 C) (Oral)   Wt 166 lb (75.3 kg)   SpO2 98%   BMI 26.79 kg/m   BP Readings from Last 3 Encounters:  01/05/18 114/70  07/03/17 104/70  04/24/17 104/72    Wt Readings from Last 3 Encounters:  01/05/18 166 lb (75.3 kg)  07/03/17 165 lb 12.8 oz (75.2 kg)  04/24/17 162 lb 12.8 oz (73.8 kg)    Physical Exam  Constitutional: She is oriented to person, place, and time.  She appears well-developed and well-nourished. No distress.  HENT:  Head: Normocephalic and atraumatic.  Right Ear: External ear normal.  Left Ear: External ear normal.  Eyes: Right eye exhibits no discharge. Left eye exhibits no discharge. No scleral icterus.  Neck: Neck supple. No JVD present. No tracheal deviation present.  Pulmonary/Chest: Effort normal.  Neurological: She is alert and oriented to person, place, and time.  Skin: She is not diaphoretic.  Psychiatric: She has a normal mood and affect. Her behavior is normal.    Lab Results  Component Value Date   GLUCOSE 100 (H) 08/29/2016   CHOL 193  12/19/2016   TRIG 164.0 (H) 12/19/2016   HDL 29.20 (L) 12/19/2016   LDLDIRECT 116.0 08/29/2016   LDLCALC 131 (H) 12/19/2016   ALT 19 08/29/2016   AST 18 08/29/2016   NA 142 08/29/2016   K 4.7 08/29/2016   CL 106 08/29/2016   CREATININE 0.76 08/29/2016   BUN 16 08/29/2016   CO2 30 08/29/2016   TSH 1.41 01/16/2017   HGBA1C 5.9 07/17/2017    Dg Bone Density  Result Date: 05/10/2017 EXAM: DUAL X-RAY ABSORPTIOMETRY (DXA) FOR BONE MINERAL DENSITY IMPRESSION: Dear Dr. Alma Friendly, Your patient Suzanne Chandler completed a FRAX assessment on 05/10/2017 using the [place] (analysis version: 14.10) manufactured by [employer]. The following summarizes the results of our evaluation. PATIENT BIOGRAPHICAL: Name: Suzanne Chandler, Suzanne Chandler Patient ID: [id number] Birth Date: [birth year] Height:    66.0 in. Gender:     [age]    [age]       Weight:    162.8 lbs. Ethnicity:  White                            Exam Date: 05/10/2017 FRAX* RESULTS:  (version: 3.5) 10-year Probability of Fracture1 Major Osteoporotic Fracture2 Hip Fracture 6.1% 0.6% Population: [place] (Caucasian) Risk Factors: Tobacco User (Current Smoker) Based on Femur (Left) Neck BMD 1 -The 10-year probability of fracture may be lower than reported if the patient has received treatment. 2 -Major Osteoporotic Fracture: Clinical Spine, Forearm, Hip or Shoulder *FRAX is a Materials engineer of the [employer] for Metabolic Bone Disease, a [place] (WHO) [facility]. ASSESSMENT: The probability of a major osteoporotic fracture is 6.1% within the next ten years. The probability of a hip fracture is 0.6% within the next ten years. . Dear Dr. Alma Friendly, Your patient Suzanne Chandler completed a BMD test on 05/10/2017 using the [place] (analysis version: 14.10) manufactured by [employer]. The following summarizes the results of our evaluation. PATIENT BIOGRAPHICAL:  Name: Suzanne Chandler, Suzanne Chandler Patient ID: [id number] Birth Date: March 23, 1961 Height: 66.0 in. Gender: Female Exam Date: 05/10/2017 Weight: 162.8 lbs. Indications: Caucasian, Family Hx of Osteoporosis, Hysterectomy, Postmenopausal, Tobacco User(Current Smoker), Tobacco User (Current Smoker) Fractures: Treatments: Calcium, Vitamin D ASSESSMENT: The BMD measured at AP Spine L1-L4 is 0.977 g/cm2 with a T-score of -1.8. This patient is considered osteopenic according to [facility]) criteria. Site Region Measured Measured WHO Young Adult BMD Date       Age      Classification T-score AP Spine L1-L4 05/10/2017 55.8 Osteopenia -1.8 0.977 g/cm2 DualFemur Neck Left 05/10/2017 55.8 Osteopenia -1.1 0.880 g/cm2 World Health Organization [facility]) criteria for post-menopausal, Caucasian Women: Normal:       T-score at or above -1 SD Osteopenia:  T-score between -1 and -2.5 SD Osteoporosis: T-score at or below -2.5 SD RECOMMENDATIONS: [place] recommends that FDA-approved medical therapies be considered in postmenopausal women and men age 36 or older with a: 1. Hip or vertebral (clinical or morphometric) fracture. 2. T-score of < -2.5 at the spine or hip. 3. Ten-year fracture probability by FRAX of 3% or greater for hip fracture or 20% or greater for major osteoporotic fracture. All treatment decisions require clinical judgment and consideration of individual patient factors, including patient preferences, co-morbidities, previous drug use, risk factors not captured in the FRAX model (e.g. falls, vitamin D deficiency, increased bone turnover, interval significant decline in bone density) and possible under - or over-estimation of fracture risk by FRAX. All patients should ensure an adequate intake of dietary calcium (1200 mg/d) and vitamin D (800 IU daily) unless contraindicated. FOLLOW-UP: People with diagnosed cases of osteoporosis or at high risk for fracture should have regular bone mineral  density tests. For patients eligible for Medicare, routine testing is allowed once every 2 years. The testing frequency can be increased to one year for patients who have rapidly progressing disease, those who are receiving or discontinuing medical therapy to restore bone mass, or have additional risk factors. I have reviewed this report, and agree with the above findings. [facility] Radiology Electronically Signed   By: Marijo Conception, M.D.   On: 05/10/2017 08:47    Assessment & Plan:   Suzanne Chandler was seen today for urinary tract infection.  Diagnoses and all orders for this visit:  Abdominal pain, unspecified abdominal location -     POC Urinalysis Dipstick -     Urine Culture; Future  Urinary frequency -     Ambulatory referral to Urology -     Urine Culture; Future  Acute cystitis without hematuria -     sulfamethoxazole-trimethoprim (BACTRIM DS,SEPTRA DS) 800-160 MG tablet; Take 1 tablet by mouth 2 (two) times daily for 7 days.   I am having Suzanne Chandler start on sulfamethoxazole-trimethoprim. I am also having her maintain her clonazePAM, Plant Sterols and Stanols (CHOLEST OFF PO), QUEtiapine, VIIBRYD, estradiol, vitamin C, calcium carbonate, NONFORMULARY OR COMPOUNDED ITEM, Fish Oil, Cholecalciferol (VITAMIN D PO), lamoTRIgine, Vitamin D (Ergocalciferol), and chlorhexidine. We will continue to administer betamethasone acetate-betamethasone sodium phosphate, betamethasone acetate-betamethasone sodium phosphate, and sodium chloride.  Meds ordered this encounter  Medications  . sulfamethoxazole-trimethoprim (BACTRIM DS,SEPTRA DS) 800-160 MG tablet    Sig: Take 1 tablet by mouth 2 (two) times daily for 7 days.    Dispense:  14 tablet    Refill:  0   Urology refrerral due to frequency. Encouraged use of e2 cream. Also advised that urine malodor is not a sign of infection as much as it is of dehydration.   Addendum: looks as though urine culture will be positive. Sending septra to  pharmacy.  Follow-up: Return if symptoms worsen or fail to improve.  Libby Maw, MD

## 2018-01-07 MED ORDER — SULFAMETHOXAZOLE-TRIMETHOPRIM 800-160 MG PO TABS: 1.0000 | ORAL_TABLET | Freq: Two times a day (BID) | ORAL | 0 refills | Status: AC

## 2018-01-07 NOTE — Addendum Note (Signed)
Addended by: Jon Billings on: 01/07/2018 09:27 AM   Modules accepted: Orders

## 2018-01-08 ENCOUNTER — Telehealth: Payer: Self-pay | Admitting: Primary Care

## 2018-01-08 LAB — URINE CULTURE

## 2018-01-08 NOTE — Telephone Encounter (Signed)
Copied from [place] [id number]. Topic: Quick Communication - See Telephone Encounter >> Jan 08, 2018 11:27 AM Synthia Innocent wrote: CRM for notification. See Telephone encounter for: 01/08/18.Dr Ethelene Hal is working on referral to urologist, would like Allie Bossier to look into that as well. Repeat on lab work? Please advise Would her insurance come glucose meter? Should would to start checking her sugars.

## 2018-01-08 NOTE — Telephone Encounter (Signed)
Looks like patient's due for CPE, please schedule her so we can repeat labs and discuss her concerns.

## 2018-01-08 NOTE — Telephone Encounter (Signed)
Message left for patient to return my call.  

## 2018-01-09 NOTE — Telephone Encounter (Signed)
Spoken to patient and CPE with lab prior has been scheduled.

## 2018-01-10 ENCOUNTER — Ambulatory Visit
Admission: RE | Admit: 2018-01-10 | Discharge: 2018-01-10 | Disposition: A | Discharge status: Home / Self Care | Payer: BLUE CROSS/BLUE SHIELD | Source: Ambulatory Visit | Attending: Physician Assistant | Admitting: Physician Assistant

## 2018-01-10 DIAGNOSIS — M7542 Impingement syndrome of left shoulder: Secondary | ICD-10-CM

## 2018-01-10 DIAGNOSIS — M75102 Unspecified rotator cuff tear or rupture of left shoulder, not specified as traumatic: Secondary | ICD-10-CM | POA: Diagnosis not present

## 2018-01-14 ENCOUNTER — Other Ambulatory Visit: Payer: Self-pay | Admitting: Primary Care

## 2018-01-14 DIAGNOSIS — E559 Vitamin D deficiency, unspecified: Secondary | ICD-10-CM

## 2018-01-14 DIAGNOSIS — R7303 Prediabetes: Secondary | ICD-10-CM

## 2018-01-14 DIAGNOSIS — R8290 Unspecified abnormal findings in urine: Secondary | ICD-10-CM

## 2018-01-14 DIAGNOSIS — E7800 Pure hypercholesterolemia, unspecified: Secondary | ICD-10-CM

## 2018-01-21 DIAGNOSIS — M75102 Unspecified rotator cuff tear or rupture of left shoulder, not specified as traumatic: Secondary | ICD-10-CM | POA: Diagnosis not present

## 2018-01-23 ENCOUNTER — Other Ambulatory Visit (INDEPENDENT_AMBULATORY_CARE_PROVIDER_SITE_OTHER): Payer: BLUE CROSS/BLUE SHIELD

## 2018-01-23 DIAGNOSIS — E7800 Pure hypercholesterolemia, unspecified: Secondary | ICD-10-CM

## 2018-01-23 DIAGNOSIS — R7303 Prediabetes: Secondary | ICD-10-CM

## 2018-01-23 DIAGNOSIS — R8290 Unspecified abnormal findings in urine: Secondary | ICD-10-CM

## 2018-01-23 DIAGNOSIS — E559 Vitamin D deficiency, unspecified: Secondary | ICD-10-CM | POA: Diagnosis not present

## 2018-01-23 LAB — HEMOGLOBIN A1C: HEMOGLOBIN A1C: 5.9 % (ref 4.6–6.5)

## 2018-01-23 LAB — POC URINALSYSI DIPSTICK (AUTOMATED)
BILIRUBIN UA: NEGATIVE
Glucose, UA: NEGATIVE
Ketones, UA: NEGATIVE
Leukocytes, UA: NEGATIVE
NITRITE UA: NEGATIVE
PH UA: 6 (ref 5.0–8.0)
Protein, UA: NEGATIVE
Spec Grav, UA: >=1.03 — AB (ref 1.010–1.025)
UROBILINOGEN UA: 0.2 U/dL

## 2018-01-23 LAB — LIPID PANEL
Cholesterol: 191 mg/dL (ref 0–200)
HDL: 33.4 mg/dL — AB (ref >39.00)
NonHDL: 157.33
TRIGLYCERIDES: 236 mg/dL — AB (ref 0.0–149.0)
Total CHOL/HDL Ratio: 6
VLDL: 47.2 mg/dL — ABNORMAL HIGH (ref 0.0–40.0)

## 2018-01-23 LAB — COMPREHENSIVE METABOLIC PANEL
ALT: 11 U/L (ref 0–35)
AST: 11 U/L (ref 0–37)
Albumin: 4.3 g/dL (ref 3.5–5.2)
Alkaline Phosphatase: 65 U/L (ref 39–117)
BUN: 20 mg/dL (ref 6–23)
CALCIUM: 9.8 mg/dL (ref 8.4–10.5)
CHLORIDE: 104 meq/L (ref 96–112)
CO2: 27 mEq/L (ref 19–32)
CREATININE: 0.74 mg/dL (ref 0.40–1.20)
GFR: 86.1 mL/min (ref >60.00)
Glucose, Bld: 101 mg/dL — ABNORMAL HIGH (ref 70–99)
POTASSIUM: 4.2 meq/L (ref 3.5–5.1)
Sodium: 139 mEq/L (ref 135–145)
Total Bilirubin: 0.4 mg/dL (ref 0.2–1.2)
Total Protein: 7 g/dL (ref 6.0–8.3)

## 2018-01-23 LAB — VITAMIN D 25 HYDROXY (VIT D DEFICIENCY, FRACTURES): VITD: 23.11 ng/mL — ABNORMAL LOW (ref 30.00–100.00)

## 2018-01-23 LAB — LDL CHOLESTEROL, DIRECT: LDL DIRECT: 139 mg/dL

## 2018-01-29 ENCOUNTER — Ambulatory Visit (INDEPENDENT_AMBULATORY_CARE_PROVIDER_SITE_OTHER): Payer: BLUE CROSS/BLUE SHIELD | Admitting: Primary Care

## 2018-01-29 ENCOUNTER — Encounter: Payer: Self-pay | Admitting: Primary Care

## 2018-01-29 VITALS — BP 112/66 | HR 60 | Temp 98.3°F | Ht 66.0 in | Wt 166.5 lb

## 2018-01-29 DIAGNOSIS — F32A Depression, unspecified: Secondary | ICD-10-CM

## 2018-01-29 DIAGNOSIS — R7303 Prediabetes: Secondary | ICD-10-CM

## 2018-01-29 DIAGNOSIS — E785 Hyperlipidemia, unspecified: Secondary | ICD-10-CM | POA: Diagnosis not present

## 2018-01-29 DIAGNOSIS — M25519 Pain in unspecified shoulder: Secondary | ICD-10-CM

## 2018-01-29 DIAGNOSIS — Z0000 Encounter for general adult medical examination without abnormal findings: Secondary | ICD-10-CM | POA: Diagnosis not present

## 2018-01-29 DIAGNOSIS — M25512 Pain in left shoulder: Secondary | ICD-10-CM

## 2018-01-29 DIAGNOSIS — Z122 Encounter for screening for malignant neoplasm of respiratory organs: Secondary | ICD-10-CM | POA: Diagnosis not present

## 2018-01-29 DIAGNOSIS — Z1159 Encounter for screening for other viral diseases: Secondary | ICD-10-CM | POA: Diagnosis not present

## 2018-01-29 DIAGNOSIS — E559 Vitamin D deficiency, unspecified: Secondary | ICD-10-CM

## 2018-01-29 DIAGNOSIS — F329 Major depressive disorder, single episode, unspecified: Secondary | ICD-10-CM

## 2018-01-29 DIAGNOSIS — Z720 Tobacco use: Secondary | ICD-10-CM | POA: Insufficient documentation

## 2018-01-29 DIAGNOSIS — G8929 Other chronic pain: Secondary | ICD-10-CM

## 2018-01-29 DIAGNOSIS — N952 Postmenopausal atrophic vaginitis: Secondary | ICD-10-CM

## 2018-01-29 DIAGNOSIS — F419 Anxiety disorder, unspecified: Secondary | ICD-10-CM

## 2018-01-29 DIAGNOSIS — Z1231 Encounter for screening mammogram for malignant neoplasm of breast: Secondary | ICD-10-CM | POA: Diagnosis not present

## 2018-01-29 DIAGNOSIS — N390 Urinary tract infection, site not specified: Secondary | ICD-10-CM

## 2018-01-29 DIAGNOSIS — Z1239 Encounter for other screening for malignant neoplasm of breast: Secondary | ICD-10-CM

## 2018-01-29 MED ORDER — ESTRADIOL 0.1 MG/GM VA CREA
TOPICAL_CREAM | VAGINAL | 0 refills | Status: DC
Start: 2018-01-29 — End: 2024-03-06

## 2018-01-29 NOTE — Assessment & Plan Note (Signed)
Currently following with psychiatry, weaned off of Lamictal. Using Clonazepam twice monthly on average. She plans on finding a holistic psychiatrist.

## 2018-01-29 NOTE — Patient Instructions (Addendum)
[facility] to schedule your [facility].  You will be contacted regarding your referral for Lung Cancer screening.  Please let us know if you have not been contacted within one week.   Start exercising. You should be getting 150 minutes of moderate intensity exercise weekly.  It's important to improve your diet by reducing consumption of fast food, fried food, processed snack foods, sugary drinks. Increase consumption of fresh vegetables and fruits, whole grains, water.  Ensure you are drinking 64 ounces of water daily.  Resume your Vitamin D and Calcium as discussed.  Schedule a lab only appointment to return in 6 months for cholesterol, diabetes, vitamin D, and Hepatitis C check.  It was a pleasure to see you today!

## 2018-01-29 NOTE — Assessment & Plan Note (Signed)
Recent level of 23. Has not been compliant to her vitamin D 5000 units, plans on restarting. Will repeat in 6 months.

## 2018-01-29 NOTE — Assessment & Plan Note (Signed)
Recent lipid panel with LDL of 139, with HDL of 33, TC of 191. Has recently started "CholestOff Plus" from [employer].   The 10-year ASCVD risk score [name], et al., 2013) is: 5.9%   Values used to calculate the score:     Age: 57 years     Sex: Female     Is Non-Hispanic African American: No     Diabetic: No     Tobacco smoker: Yes     Systolic Blood Pressure: 350 mmHg     Is BP treated: No     HDL Cholesterol: 33.4 mg/dL     Total Cholesterol: 191 mg/dL

## 2018-01-29 NOTE — Assessment & Plan Note (Signed)
Poor diet and is not exercising. Recommended to reduce fried/fast food, sweets, sugary drinks. A1C of 5.9 on recent labs in 6 months.

## 2018-01-29 NOTE — Assessment & Plan Note (Signed)
Following with Urology, plans on scheduling a follow up visit for May. Refilled Estrace cream for vaginal dryness and UTI prevention.

## 2018-01-29 NOTE — Progress Notes (Signed)
Subjective:    Patient ID: Suzanne Chandler, female    DOB: [birth year], 57 y.o.   MRN: [id number]  HPI  Suzanne Chandler Chandler a 57 year old female who presents today for complete physical.  Has been seeing orthopedics for left shoulder tendonitis and will be starting PT soon. If no improvement then may undergo surgical intervention.  She did have a urinary tract infection several weeks ago, treated with Bactrim. She plans on calling her Urologist for a follow up visit.   Has not been taking calcium and vitamin D.  Looking for a "holistic" psychiatrist, has weaned off Lamictal per her current psychiatrist. Denies SI/HI. Uses Clonazepam 1-2 times monthly on average.   Immunizations: -Tetanus: Completed in 2012  Diet: She endorses a fair diet. Breakfast: Skips Lunch: Chicken wings from the "hot bar" at work, salads, fast food Dinner: Beans, chicken, pork, some beef, snap peas, green beans, squash Snacks: None Desserts: Snack cakes, candy bars Beverages: Sweet tea, some soda, some water  Exercise: She does not exercise, Chandler active at work Eye exam: No recent exam Dental exam:  Follows often Colonoscopy: Completed in 2018 Dexa: Completed in 2018, osteopenia Pap Smear: Hysterectomy  Mammogram: Due now.   The 10-year ASCVD risk score Suzanne Chandler: 5.9%   Values used to calculate the score:     Age: 41 years     Sex: Female     Chandler Non-Hispanic African American: No     Diabetic: No     Tobacco smoker: Yes     Systolic Blood Pressure: 242 mmHg     Chandler BP treated: No     HDL Cholesterol: 33.4 mg/dL     Total Cholesterol: 191 mg/dL   Review of Systems  Constitutional: Negative for unexpected weight change.  HENT: Negative for rhinorrhea.   Respiratory: Negative for cough and shortness of breath.   Cardiovascular: Negative for chest pain.  Gastrointestinal: Negative for constipation and diarrhea.  Genitourinary: Negative for difficulty urinating.   Musculoskeletal: Negative for arthralgias and myalgias.  Skin: Negative for rash.  Allergic/Immunologic: Negative for environmental allergies.  Neurological: Negative for dizziness, numbness and headaches.  Psychiatric/Behavioral: Negative for suicidal ideas.       Overall doing well off Lamictal.        Past Medical History:  Diagnosis Date  . Anxiety   . Anxiety and depression   . Arthritis   . Bipolar disorder ([place])   . Borderline diabetes   . Depression   . Female bladder prolapse   . Hyperlipemia   . Urinary incontinence   . Urinary tract infection      Social History   Socioeconomic History  . Marital status: Widowed    Spouse name: Not on file  . Number of children: Not on file  . Years of education: Not on file  . Highest education level: Not on file  Occupational History  . Not on file  Social Needs  . Financial resource strain: Not on file  . Food insecurity:    Worry: Not on file    Inability: Not on file  . Transportation needs:    Medical: Not on file    Non-medical: Not on file  Tobacco Use  . Smoking status: Current Every Day Smoker    Packs/day: 1.00    Years: 33.00    Pack years: 33.00    Types: Cigarettes  . Smokeless tobacco: Never Used  Substance and Sexual Activity  .  Alcohol use: No  . Drug use: No  . Sexual activity: Not on file  Lifestyle  . Physical activity:    Days per week: Not on file    Minutes per session: Not on file  . Stress: Not on file  Relationships  . Social connections:    Talks on phone: Not on file    Gets together: Not on file    Attends religious service: Not on file    Active member of club or organization: Not on file    Attends meetings of clubs or organizations: Not on file    Relationship status: Not on file  . Intimate partner violence:    Fear of current or ex partner: Not on file    Emotionally abused: Not on file    Physically abused: Not on file    Forced sexual activity: Not on file  Other  Topics Concern  . Not on file  Social History Narrative   Widow   Works in Therapist, art.   Highest level of education Chandler GED.   Enjoys spending time with family.    Past Surgical History:  Procedure Laterality Date  . ABDOMINAL HYSTERECTOMY  2014  . BREAST EXCISIONAL BIOPSY Right 05/2013   neg  . CHOLECYSTECTOMY  1985  . CYSTOCELE REPAIR  2015  . TUBAL LIGATION  1987    Family History  Problem Relation Age of Onset  . Ovarian cancer Sister   . Arthritis Sister   . COPD Sister   . Anxiety disorder Sister   . Hyperlipidemia Mother   . Hypertension Mother   . Diabetes Mother   . Depression Mother   . Heart disease Father   . Stroke Father   . Heart attack Father   . Depression Brother   . Anxiety disorder Brother   . Colon cancer Neg Hx   . Esophageal cancer Neg Hx   . Rectal cancer Neg Hx   . Stomach cancer Neg Hx     No Known Allergies  Current Outpatient Medications on File Prior to Visit  Medication Sig Dispense Refill  . Ascorbic Acid (VITAMIN C) 1000 MG tablet Take 1,000 mg by mouth daily.    . calcium carbonate 1250 MG capsule Take 1,250 mg by mouth 2 (two) times daily with a meal.    . chlorhexidine (PERIDEX) 0.12 % solution   0  . Cholecalciferol (VITAMIN D PO) Take 5,000 Units by mouth daily.    . clonazePAM (KLONOPIN) 0.5 MG tablet Take 0.25-0.5 mg by mouth 2 (two) times daily as needed for anxiety.     Marland Kitchen estradiol (ESTRACE VAGINAL) 0.1 MG/GM vaginal cream Apply vaginally as needed for vaginal dryness. 42.5 g 0  . NONFORMULARY OR COMPOUNDED ITEM Take 1 tablet by mouth daily.    . Omega-3 Fatty Acids (FISH OIL) 1000 MG CAPS Take 1,000 mg by mouth 3 (three) times daily.    . Plant Sterols and Stanols (CHOLEST OFF PO) Take 2 capsules by mouth daily.      Current Facility-Administered Medications on File Prior to Visit  Medication Dose Route Frequency Provider Last Rate Last Dose  . 0.9 %  sodium chloride infusion  500 mL Intravenous Continuous  Nandigam, Kavitha V, MD      . betamethasone acetate-betamethasone sodium phosphate (CELESTONE) injection 12 mg  12 mg Intramuscular Once Daylene Katayama M, DPM      . betamethasone acetate-betamethasone sodium phosphate (CELESTONE) injection 12 mg  12 mg Intramuscular Once Daylene Katayama  M, DPM        BP 112/66   Pulse 60   Temp 98.3 F (36.8 C) (Oral)   Ht 5\' 6"  (1.676 m)   Wt 166 lb 8 oz (75.5 kg)   SpO2 98%   BMI 26.87 kg/m    Objective:   Physical Exam  Constitutional: She Chandler oriented to person, place, and time. She appears well-nourished.  HENT:  Right Ear: Tympanic membrane and ear canal normal.  Left Ear: Tympanic membrane and ear canal normal.  Nose: Nose normal.  Mouth/Throat: Oropharynx Chandler clear and moist.  Eyes: Pupils are equal, round, and reactive to light. Conjunctivae and EOM are normal.  Neck: Neck supple. No thyromegaly present.  Cardiovascular: Normal rate and regular rhythm.  No murmur heard. Pulmonary/Chest: Effort normal and breath sounds normal. She has no rales.  Abdominal: Soft. Bowel sounds are normal. There Chandler no tenderness.  Musculoskeletal: Normal range of motion.  Lymphadenopathy:    She has no cervical adenopathy.  Neurological: She Chandler alert and oriented to person, place, and time. She has normal reflexes. No cranial nerve deficit.  Skin: Skin Chandler warm and dry. No rash noted.  Psychiatric: She has a normal mood and affect.  [place]lighty with sentences and topics. This Chandler typical for her. No changes.          Assessment & Plan:

## 2018-01-29 NOTE — Assessment & Plan Note (Signed)
Endorses 34 pack year history, not ready to quit. Referral placed for lung cancer screening.

## 2018-01-29 NOTE — Assessment & Plan Note (Signed)
Following with [facility], underwent MRI of the left shoulder in early April with moderate tendonitis. She is involved in physical therapy. May undergo surgical intervention if no improvement.

## 2018-01-29 NOTE — Assessment & Plan Note (Addendum)
Tetanus vaccination UTD. Mammogram due, ordered today. Colonoscopy UTD. Lung cancer screening referral placed. Discussed the importance of a healthy diet and regular exercise in order for weight loss, and to reduce the risk of any potential medical problems. Exam unremarkable. Labs stable, will continue to monitor. Follow up in 1 year for CPE.

## 2018-02-04 ENCOUNTER — Telehealth: Payer: Self-pay | Admitting: Acute Care

## 2018-02-05 NOTE — Telephone Encounter (Signed)
Spoke with pt regarding lung cancer screening.  Pt was advised that [place] does not cover this.  Pt was given the out of pocket cost options .  Pt wants to think about it and have me call her back.  Will defer at this time. Will close this message and refer to referral notes.

## 2018-02-07 ENCOUNTER — Other Ambulatory Visit: Payer: Self-pay | Admitting: Primary Care

## 2018-02-07 ENCOUNTER — Encounter: Payer: Self-pay | Admitting: Primary Care

## 2018-02-07 ENCOUNTER — Encounter: Payer: Self-pay | Admitting: Family Medicine

## 2018-02-07 DIAGNOSIS — R928 Other abnormal and inconclusive findings on diagnostic imaging of breast: Secondary | ICD-10-CM

## 2018-02-26 ENCOUNTER — Ambulatory Visit
Admission: RE | Admit: 2018-02-26 | Discharge: 2018-02-26 | Disposition: A | Discharge status: Home / Self Care | Payer: BLUE CROSS/BLUE SHIELD | Source: Ambulatory Visit | Attending: Primary Care | Admitting: Primary Care

## 2018-02-26 ENCOUNTER — Other Ambulatory Visit: Payer: Self-pay | Admitting: Primary Care

## 2018-02-26 DIAGNOSIS — R928 Other abnormal and inconclusive findings on diagnostic imaging of breast: Secondary | ICD-10-CM | POA: Diagnosis not present

## 2018-02-26 DIAGNOSIS — N6489 Other specified disorders of breast: Secondary | ICD-10-CM

## 2018-05-23 ENCOUNTER — Ambulatory Visit: Payer: BLUE CROSS/BLUE SHIELD | Admitting: Primary Care

## 2018-05-23 ENCOUNTER — Encounter: Payer: Self-pay | Admitting: Primary Care

## 2018-05-23 VITALS — BP 112/64 | HR 67 | Temp 98.0°F | Ht 66.0 in | Wt 167.2 lb

## 2018-05-23 DIAGNOSIS — L989 Disorder of the skin and subcutaneous tissue, unspecified: Secondary | ICD-10-CM | POA: Diagnosis not present

## 2018-05-23 NOTE — Patient Instructions (Signed)
Stop by the front desk and speak with either Rosaria Ferries regarding your referral to Dermatology.   Wear sunscreen when outdoors.  Continue to work on quitting by cutting back on cigarettes.  It was a pleasure to see you today!

## 2018-05-23 NOTE — Progress Notes (Signed)
Subjective:    Patient ID: Suzanne Chandler, female    DOB: Oct 08, 1961, 57 y.o.   MRN: [id number]  HPI  Suzanne Chandler is a 58 year old female who presents today with a chief complaint of skin lesion.   The lesion is located to the left lateral nose which she first noticed 2 months ago. It began as a whitish "rough spot" without discoloration, and over the last several months she noticed it would "ooze" and crust over. She has removed the scab several times which will regrow without healing.   She is currently smoking, working on cutting back. She does work outdoors often for a Social worker and does not wear sunscreen.   Review of Systems  Constitutional: Negative for unexpected weight change.  Skin:       Skin lesion  Allergic/Immunologic: Positive for environmental allergies.       Past Medical History:  Diagnosis Date  . Anxiety   . Anxiety and depression   . Arthritis   . Bipolar disorder ([place])   . Borderline diabetes   . Depression   . Female bladder prolapse   . Hyperlipemia   . Urinary incontinence   . Urinary tract infection      Social History   Socioeconomic History  . Marital status: Divorced    Spouse name: Not on file  . Number of children: Not on file  . Years of education: Not on file  . Highest education level: Not on file  Occupational History  . Not on file  Social Needs  . Financial resource strain: Not on file  . Food insecurity:    Worry: Not on file    Inability: Not on file  . Transportation needs:    Medical: Not on file    Non-medical: Not on file  Tobacco Use  . Smoking status: Current Every Day Smoker    Packs/day: 1.00    Years: 33.00    Pack years: 33.00    Types: Cigarettes  . Smokeless tobacco: Never Used  Substance and Sexual Activity  . Alcohol use: No  . Drug use: No  . Sexual activity: Not on file  Lifestyle  . Physical activity:    Days per week: Not on file    Minutes per session: Not on file  .  Stress: Not on file  Relationships  . Social connections:    Talks on phone: Not on file    Gets together: Not on file    Attends religious service: Not on file    Active member of club or organization: Not on file    Attends meetings of clubs or organizations: Not on file    Relationship status: Not on file  . Intimate partner violence:    Fear of current or ex partner: Not on file    Emotionally abused: Not on file    Physically abused: Not on file    Forced sexual activity: Not on file  Other Topics Concern  . Not on file  Social History Narrative   Widow   Works in Therapist, art.   Highest level of education is GED.   Enjoys spending time with family.    Past Surgical History:  Procedure Laterality Date  . ABDOMINAL HYSTERECTOMY  2014  . BREAST EXCISIONAL BIOPSY Right 2012   neg  . CHOLECYSTECTOMY  1985  . CYSTOCELE REPAIR  2015  . TUBAL LIGATION  1987    Family History  Problem Relation Age of  Onset  . Ovarian cancer Sister   . Arthritis Sister   . COPD Sister   . Anxiety disorder Sister   . Hyperlipidemia Mother   . Hypertension Mother   . Diabetes Mother   . Depression Mother   . Heart disease Father   . Stroke Father   . Heart attack Father   . Depression Brother   . Anxiety disorder Brother   . Colon cancer Neg Hx   . Esophageal cancer Neg Hx   . Rectal cancer Neg Hx   . Stomach cancer Neg Hx     No Known Allergies  Current Outpatient Medications on File Prior to Visit  Medication Sig Dispense Refill  . 5-Hydroxytryptophan (5-HTP PO) Take by mouth.    . Ascorbic Acid (VITAMIN C) 1000 MG tablet Take 1,000 mg by mouth daily.    . calcium carbonate 1250 MG capsule Take 1,250 mg by mouth 2 (two) times daily with a meal.    . chlorhexidine (PERIDEX) 0.12 % solution   0  . Cholecalciferol (VITAMIN D PO) Take 5,000 Units by mouth daily.    . clonazePAM (KLONOPIN) 0.5 MG tablet Take 0.25-0.5 mg by mouth 2 (two) times daily as needed for anxiety.      Marland Kitchen estradiol (ESTRACE VAGINAL) 0.1 MG/GM vaginal cream Apply vaginally as needed for vaginal dryness. 42.5 g 0  . NONFORMULARY OR COMPOUNDED ITEM Take 1 tablet by mouth daily.    . Omega-3 Fatty Acids (FISH OIL) 1000 MG CAPS Take 1,000 mg by mouth 3 (three) times daily.    . Plant Sterols and Stanols (CHOLEST OFF PO) Take 2 capsules by mouth daily.      Current Facility-Administered Medications on File Prior to Visit  Medication Dose Route Frequency Provider Last Rate Last Dose  . 0.9 %  sodium chloride infusion  500 mL Intravenous Continuous Nandigam, Kavitha V, MD      . betamethasone acetate-betamethasone sodium phosphate (CELESTONE) injection 12 mg  12 mg Intramuscular Once Daylene Katayama M, DPM      . betamethasone acetate-betamethasone sodium phosphate (CELESTONE) injection 12 mg  12 mg Intramuscular Once Evans, Brent M, DPM        BP 112/64   Pulse 67   Temp 98 F (36.7 C) (Oral)   Ht 5\' 6"  (1.676 m)   Wt 167 lb 4 oz (75.9 kg)   SpO2 98%   BMI 26.99 kg/m    Objective:   Physical Exam  Constitutional: She appears well-nourished.  Cardiovascular: Normal rate and regular rhythm.  Respiratory: Effort normal and breath sounds normal. She has no wheezes.  Skin: Skin is warm and dry.  0.25 cm circular dark lesion to her left lateral nose. Crusting.            Assessment & Plan:  Skin Lesion:  Located to the left lateral nose x 2 months. Current smoker.  Exam today suspicious for skin cancer. Referral place to dermatology for further evaluation.  Pleas Koch, NP

## 2018-06-04 DIAGNOSIS — Z1212 Encounter for screening for malignant neoplasm of rectum: Secondary | ICD-10-CM | POA: Diagnosis not present

## 2018-06-04 DIAGNOSIS — Z1211 Encounter for screening for malignant neoplasm of colon: Secondary | ICD-10-CM | POA: Diagnosis not present

## 2018-06-04 LAB — COLOGUARD: Cologuard: NEGATIVE

## 2018-06-13 ENCOUNTER — Telehealth: Payer: Self-pay | Admitting: Primary Care

## 2018-06-13 NOTE — Telephone Encounter (Signed)
Please notify patient that her cologuard test was negative. No suspicion for colon cancer. We will repeat in 3 years.

## 2018-06-18 DIAGNOSIS — C44311 Basal cell carcinoma of skin of nose: Secondary | ICD-10-CM | POA: Diagnosis not present

## 2018-07-01 NOTE — Telephone Encounter (Signed)
Sending letter with results and Kate Clark's comments for patient.  

## 2018-07-17 ENCOUNTER — Telehealth: Payer: Self-pay

## 2018-07-17 NOTE — Telephone Encounter (Signed)
Noted  

## 2018-07-17 NOTE — Telephone Encounter (Signed)
Patient saw Dr Nevada Crane sooner in September then appointment that was made with [facility]. Patient said she did get diagnosed with BCC and the area of her nose was cut out. Patient will ask their office to send [place] the office notes and procedure notes-Nicoya Friel V Trinita Devlin, RMA

## 2018-07-17 NOTE — Telephone Encounter (Signed)
Left message to follow up on cancelled appointment with dermatologist-Etter Royall V Alycen Mack, RMA

## 2018-07-31 ENCOUNTER — Other Ambulatory Visit: Payer: BLUE CROSS/BLUE SHIELD

## 2018-08-09 DIAGNOSIS — Z08 Encounter for follow-up examination after completed treatment for malignant neoplasm: Secondary | ICD-10-CM | POA: Diagnosis not present

## 2018-08-09 DIAGNOSIS — Z85828 Personal history of other malignant neoplasm of skin: Secondary | ICD-10-CM | POA: Diagnosis not present

## 2019-03-20 ENCOUNTER — Encounter: Payer: Self-pay | Admitting: Primary Care

## 2019-03-20 ENCOUNTER — Ambulatory Visit (INDEPENDENT_AMBULATORY_CARE_PROVIDER_SITE_OTHER): Payer: BLUE CROSS/BLUE SHIELD | Admitting: Primary Care

## 2019-03-20 VITALS — Wt 167.2 lb

## 2019-03-20 DIAGNOSIS — F419 Anxiety disorder, unspecified: Secondary | ICD-10-CM

## 2019-03-20 DIAGNOSIS — F32A Depression, unspecified: Secondary | ICD-10-CM

## 2019-03-20 DIAGNOSIS — F329 Major depressive disorder, single episode, unspecified: Secondary | ICD-10-CM | POA: Diagnosis not present

## 2019-03-20 MED ORDER — HYDROXYZINE HCL 10 MG PO TABS: 10.0000 mg | ORAL_TABLET | Freq: Two times a day (BID) | ORAL | 0 refills | Status: DC | PRN

## 2019-03-20 NOTE — Patient Instructions (Signed)
You may try the hydroxyzine as needed for breakthrough anxiety. Caution as this may cause drowsiness.   We will see you for your physical in July or August 2020.  It was a pleasure to see you today! Allie Bossier, NP-C

## 2019-03-20 NOTE — Assessment & Plan Note (Signed)
Increased stress with new occupation and taking care of her parents. Discussed that medications such as clonazepam are not safe and that I do not prescribed. Also discussed that if she's experiencing daily anxiety symptoms then we would need to consider SSRI or other treatment.   She would just like something PRN as she's able to overall handle anxiety. Rx for low dose hydroxyzine sent to pharmacy, drowsiness precautions provided. She will update.

## 2019-03-20 NOTE — Progress Notes (Signed)
Subjective:    Patient ID: Suzanne Chandler, female    DOB: Aug 26, 1961, 58 y.o.   MRN: [id number]  HPI  Virtual Visit via Video Note  I connected with Suzanne Chandler on 03/20/19 at 11:00 AM EDT by a video enabled telemedicine application and verified that I am speaking with the correct person using two identifiers.  Location: Patient: Parking lot Provider: Office   I discussed the limitations of evaluation and management by telemedicine and the availability of in person appointments. The patient expressed understanding and agreed to proceed.  We attempted to connect via video but the video failed. We had to complete the majority of our visit via phone. This lasted 8 minutes and 16 seconds.  History of Present Illness:  Ms. Korte is a 57 year old female with a history of anxiety and depression who presents today with a chief complaint of anxiety and stress.  She's currently under a lot of stress as she is caring for her mother. She has also had to switch jobs. Symptoms include feeling anxious, feeling nervous. Her last dose of Clonazepam was about one year ago, she is requesting a refill to help with these symptoms.   She was once managed on Viibryd, Seroquel, Lamictal, currently not on medications now. Overall doesn't feel as though she needs anything daily.    Observations/Objective:  Alert and oriented. No distress. Speaking in complete sentences.   Assessment and Plan:  See problem based charting.   Follow Up Instructions:  You may try the hydroxyzine as needed for breakthrough anxiety. Caution as this may cause drowsiness.   We will see you for your physical in July or August 2020.  It was a pleasure to see you today! Allie Bossier, NP-C    I discussed the assessment and treatment plan with the patient. The patient was provided an opportunity to ask questions and all were answered. The patient agreed with the plan and demonstrated an understanding of  the instructions.   The patient was advised to call back or seek an in-person evaluation if the symptoms worsen or if the condition fails to improve as anticipated.    Pleas Koch, NP    Review of Systems  Respiratory: Negative for shortness of breath.   Cardiovascular: Negative for chest pain.  Psychiatric/Behavioral: Negative for sleep disturbance. The patient is nervous/anxious.        Past Medical History:  Diagnosis Date  . Anxiety   . Anxiety and depression   . Arthritis   . Bipolar disorder ([place])   . Borderline diabetes   . Depression   . Female bladder prolapse   . Hyperlipemia   . Urinary incontinence   . Urinary tract infection      Social History   Socioeconomic History  . Marital status: Divorced    Spouse name: Not on file  . Number of children: Not on file  . Years of education: Not on file  . Highest education level: Not on file  Occupational History  . Not on file  Social Needs  . Financial resource strain: Not on file  . Food insecurity    Worry: Not on file    Inability: Not on file  . Transportation needs    Medical: Not on file    Non-medical: Not on file  Tobacco Use  . Smoking status: Current Every Day Smoker    Packs/day: 1.00    Years: 33.00    Pack years: 33.00  Types: Cigarettes  . Smokeless tobacco: Never Used  Substance and Sexual Activity  . Alcohol use: No  . Drug use: No  . Sexual activity: Not on file  Lifestyle  . Physical activity    Days per week: Not on file    Minutes per session: Not on file  . Stress: Not on file  Relationships  . Social Herbalist on phone: Not on file    Gets together: Not on file    Attends religious service: Not on file    Active member of club or organization: Not on file    Attends meetings of clubs or organizations: Not on file    Relationship status: Not on file  . Intimate partner violence    Fear of current or ex partner: Not on file    Emotionally abused: Not  on file    Physically abused: Not on file    Forced sexual activity: Not on file  Other Topics Concern  . Not on file  Social History Narrative   Widow   Works in Therapist, art.   Highest level of education is GED.   Enjoys spending time with family.    Past Surgical History:  Procedure Laterality Date  . ABDOMINAL HYSTERECTOMY  2014  . BREAST EXCISIONAL BIOPSY Right 2012   neg  . CHOLECYSTECTOMY  1985  . CYSTOCELE REPAIR  2015  . TUBAL LIGATION  1987    Family History  Problem Relation Age of Onset  . Ovarian cancer Sister   . Arthritis Sister   . COPD Sister   . Anxiety disorder Sister   . Hyperlipidemia Mother   . Hypertension Mother   . Diabetes Mother   . Depression Mother   . Heart disease Father   . Stroke Father   . Heart attack Father   . Depression Brother   . Anxiety disorder Brother   . Colon cancer Neg Hx   . Esophageal cancer Neg Hx   . Rectal cancer Neg Hx   . Stomach cancer Neg Hx     No Known Allergies  Current Outpatient Medications on File Prior to Visit  Medication Sig Dispense Refill  . 5-Hydroxytryptophan (5-HTP PO) Take by mouth.    . Ascorbic Acid (VITAMIN C) 1000 MG tablet Take 1,000 mg by mouth daily.    . calcium carbonate 1250 MG capsule Take 1,250 mg by mouth 2 (two) times daily with a meal.    . Cholecalciferol (VITAMIN D PO) Take 5,000 Units by mouth daily.    . clonazePAM (KLONOPIN) 0.5 MG tablet Take 0.25-0.5 mg by mouth 2 (two) times daily as needed for anxiety.     Marland Kitchen estradiol (ESTRACE VAGINAL) 0.1 MG/GM vaginal cream Apply vaginally as needed for vaginal dryness. 42.5 g 0  . Omega-3 Fatty Acids (FISH OIL) 1000 MG CAPS Take 1,000 mg by mouth 3 (three) times daily.    . Plant Sterols and Stanols (CHOLEST OFF PO) Take 2 capsules by mouth daily.      Current Facility-Administered Medications on File Prior to Visit  Medication Dose Route Frequency Provider Last Rate Last Dose  . 0.9 %  sodium chloride infusion  500 mL  Intravenous Continuous Nandigam, Kavitha V, MD      . betamethasone acetate-betamethasone sodium phosphate (CELESTONE) injection 12 mg  12 mg Intramuscular Once Daylene Katayama M, DPM      . betamethasone acetate-betamethasone sodium phosphate (CELESTONE) injection 12 mg  12 mg Intramuscular Once Evans,  Dorathy Daft, DPM        Wt 167 lb 4 oz (75.9 kg)   BMI 26.99 kg/m    Objective:   Physical Exam  Constitutional: She is oriented to person, place, and time.  Respiratory: Effort normal.  Neurological: She is alert and oriented to person, place, and time.  Psychiatric: She has a normal mood and affect.           Assessment & Plan:

## 2019-07-13 IMAGING — MR MR SHOULDER*L* W/O CM
5 series · 40 of 40 positions shown · non-contrast
Comparison: None.

CLINICAL DATA: Left shoulder pain, pain for 3 months

EXAM:
MRI OF THE LEFT SHOULDER WITHOUT CONTRAST
TECHNIQUE: Multiplanar, multisequence MR imaging of the shoulder was performed.
No intravenous contrast was administered.

[Series 3: T2 fat-sat · axial · 4.0mm · 0.55mm/px · z∈[-67,+34]mm · 10 of 24 slices shown (1 of 3)]
[im 1/24]
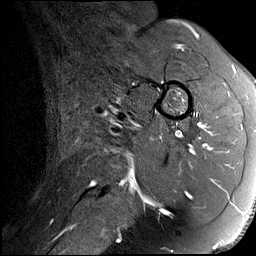
[im 3/24]
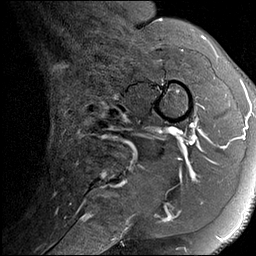
[im 6/24]
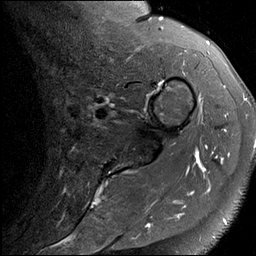
[im 8/24]
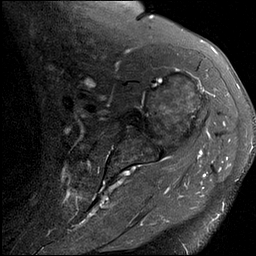
[im 11/24]
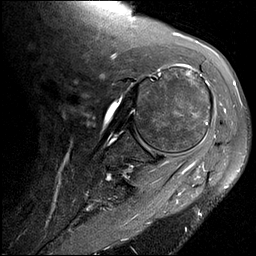
[im 13/24]
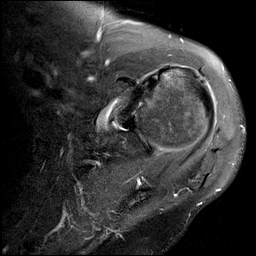
[im 16/24]
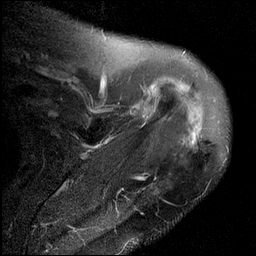
[im 18/24]
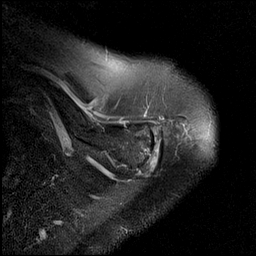
[im 21/24]
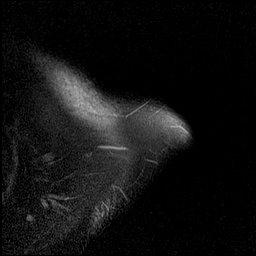
[im 24/24]
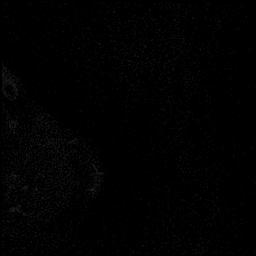

[Series 4: T2 fat-sat · oblique · 4.0mm · 0.59mm/px · 8 of 20 slices shown (2 of 3)]
[im 1/20]
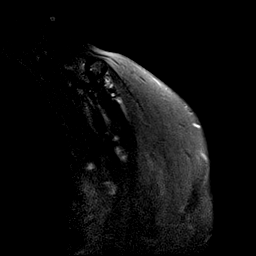
[im 3/20]
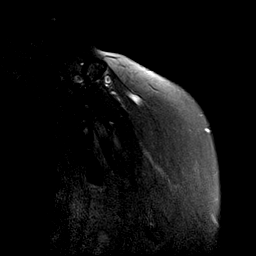
[im 6/20]
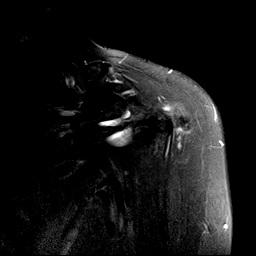
[im 9/20]
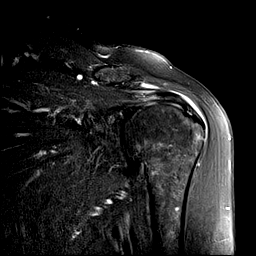
[im 11/20]
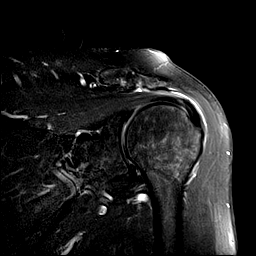
[im 14/20]
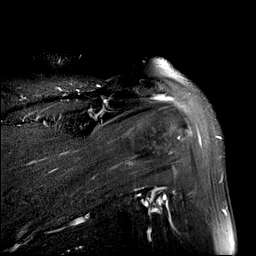
[im 17/20]
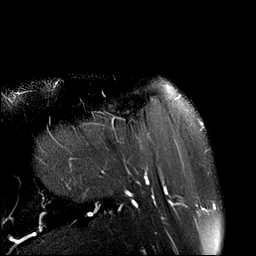
[im 20/20]
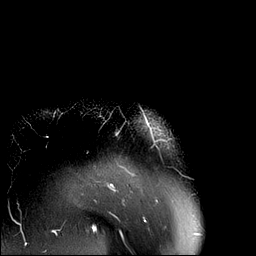

[Series 8: T2 fat-sat · oblique · 4.0mm · 0.59mm/px · 7 of 18 slices shown (3 of 3)]
[im 1/18]
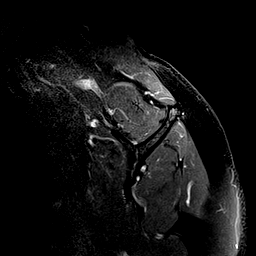
[im 3/18]
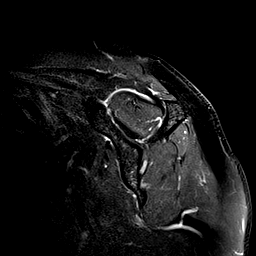
[im 6/18]
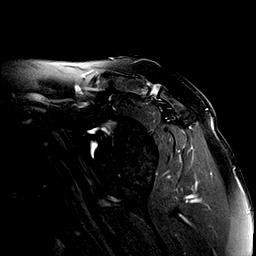
[im 9/18]
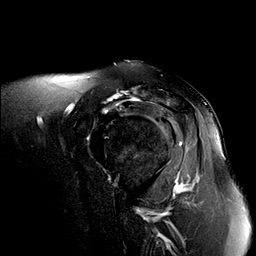
[im 12/18]
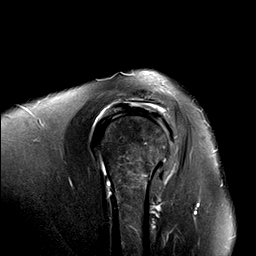
[im 15/18]
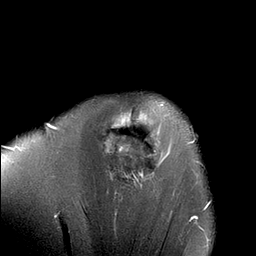
[im 18/18]
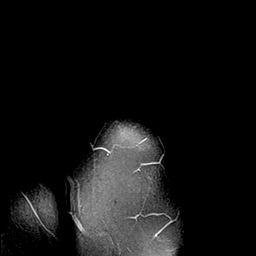

[Series 9: T1 · oblique · 4.0mm · 0.59mm/px · 7 of 18 slices shown]
[im 1/18]
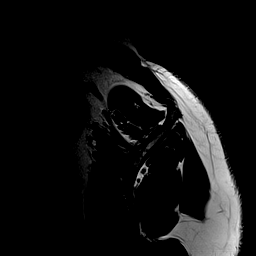
[im 3/18]
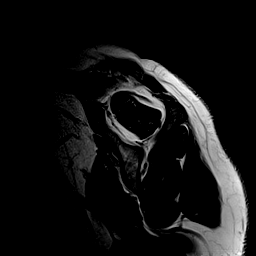
[im 6/18]
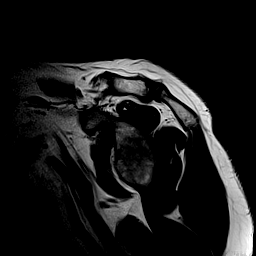
[im 9/18]
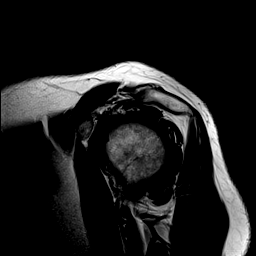
[im 12/18]
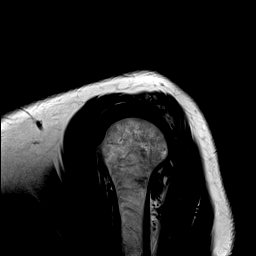
[im 15/18]
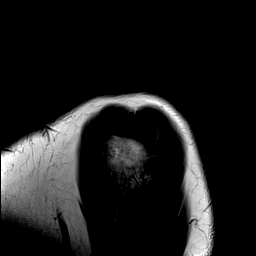
[im 18/18]
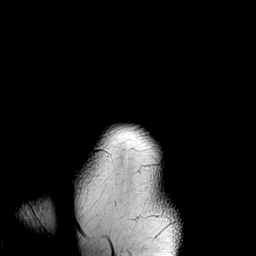

[Series 10: PD · oblique · 4.0mm · 0.59mm/px · 8 of 20 slices shown]
[im 1/20]
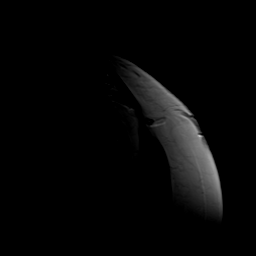
[im 3/20]
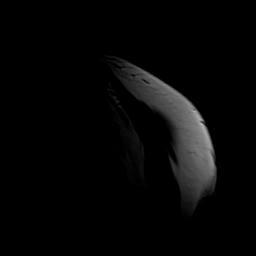
[im 6/20]
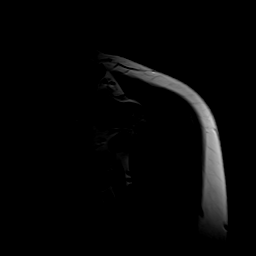
[im 9/20]
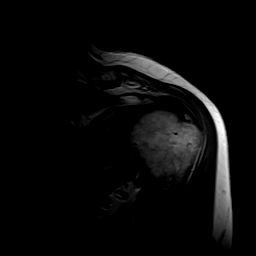
[im 11/20]
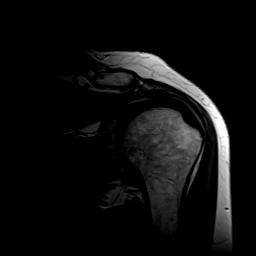
[im 14/20]
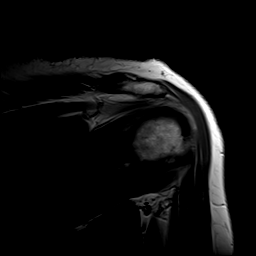
[im 17/20]
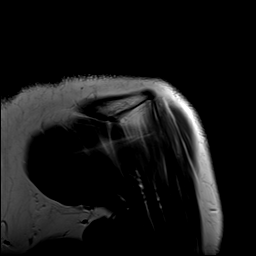
[im 20/20]
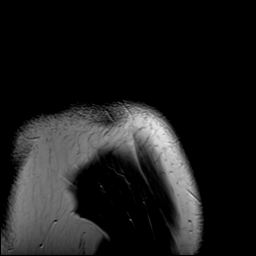

[40 of 40 positions shown; findings below may reference images not displayed]

FINDINGS: Rotator cuff: Moderate tendinosis of the supraspinatus tendon with a
high-grade insertional interstitial tear measuring 10 mm in
anterior-posterior dimension. Mild tendinosis of the infraspinatus
tendon. Teres minor tendon is intact. Subscapularis tendon is
intact.

Muscles: No atrophy or fatty replacement of nor abnormal signal
within, the muscles of the rotator cuff.

Biceps long head:  Intact.

Acromioclavicular Joint: Mild arthropathy of the acromioclavicular
joint. Type II acromion. Small amount of subacromial/subdeltoid
bursal fluid.

Glenohumeral Joint: No joint effusion.  No chondral defect.

Labrum: Grossly intact, but evaluation is limited by lack of
intraarticular fluid.

Bones:  No aggressive osseous lesion.  No acute osseous abnormality.

Other: No fluid collection or hematoma.
IMPRESSION: 1. Moderate tendinosis of the supraspinatus tendon with a high-grade
insertional interstitial tear measuring 10 mm in anterior-posterior
dimension.
2. Mild tendinosis of the infraspinatus tendon.

## 2020-09-22 ENCOUNTER — Telehealth: Payer: Self-pay | Admitting: *Deleted

## 2020-11-16 NOTE — Telephone Encounter (Signed)
No message was left.

## 2020-12-10 ENCOUNTER — Ambulatory Visit (INDEPENDENT_AMBULATORY_CARE_PROVIDER_SITE_OTHER): Payer: 59 | Admitting: Sports Medicine

## 2020-12-10 ENCOUNTER — Other Ambulatory Visit: Payer: Self-pay

## 2020-12-10 ENCOUNTER — Other Ambulatory Visit: Payer: Self-pay | Admitting: Sports Medicine

## 2020-12-10 ENCOUNTER — Ambulatory Visit (INDEPENDENT_AMBULATORY_CARE_PROVIDER_SITE_OTHER): Payer: 59

## 2020-12-10 ENCOUNTER — Other Ambulatory Visit: Payer: Self-pay | Admitting: *Deleted

## 2020-12-10 ENCOUNTER — Encounter: Payer: Self-pay | Admitting: Sports Medicine

## 2020-12-10 DIAGNOSIS — M2142 Flat foot [pes planus] (acquired), left foot: Secondary | ICD-10-CM

## 2020-12-10 DIAGNOSIS — M19071 Primary osteoarthritis, right ankle and foot: Secondary | ICD-10-CM

## 2020-12-10 DIAGNOSIS — M25571 Pain in right ankle and joints of right foot: Secondary | ICD-10-CM | POA: Diagnosis not present

## 2020-12-10 DIAGNOSIS — M2141 Flat foot [pes planus] (acquired), right foot: Secondary | ICD-10-CM | POA: Diagnosis not present

## 2020-12-10 DIAGNOSIS — M779 Enthesopathy, unspecified: Secondary | ICD-10-CM

## 2020-12-10 MED ORDER — TRIAMCINOLONE ACETONIDE 10 MG/ML IJ SUSP
10.0000 mg | Freq: Once | INTRAMUSCULAR | Status: AC
  Administered 2020-12-10: 10 mg

## 2020-12-10 MED ORDER — TRIAMCINOLONE ACETONIDE 10 MG/ML IJ SUSP: 10.0000 mg | Freq: Once | INTRAMUSCULAR | Status: DC

## 2020-12-10 NOTE — Progress Notes (Signed)
Subjective: Anilah Huck is a 60 y.o. female patient who presents to office for evaluation of right foot pain. Patient complains of progressive pain since 2019 after bumping her foot with a buggy reports that slowly the pain has gotten worse has been the most with the patient originally of Dr. Amalia Hailey and had multiple shots back in 2018 and 2019 with no improvement at the medial side of the foot patient reports now the pain seems like it has moved to the outside of the ankle on the right and has been to Dr. Ronnald Ramp as well as to see orthopedic doctors for evaluation for surgery.  Patient reports that she has instructed to stop smoking in order to be a candidate for surgery and has also has several images including x-rays and MRIs.  Patient currently using a ankle brace on the right and off ankle sleeve for the left that seems to help some however patient reports sharp shooting pain to the lateral foot and ankle that is worsened with activity.  Patient denies any other pedal complaints at this time.   Review of systems noncontributory  Patient Active Problem List   Diagnosis Date Noted  . Chronic shoulder pain 01/29/2018  . Recurrent UTI 01/29/2018  . Tobacco abuse 01/29/2018  . Prediabetes 01/16/2017  . Vitamin D deficiency 01/16/2017  . Preventative health care 01/16/2017  . Hyperlipidemia 08/29/2016  . Anxiety and depression 08/29/2016    Current Outpatient Medications on File Prior to Visit  Medication Sig Dispense Refill  . 5-Hydroxytryptophan (5-HTP PO) Take by mouth.    . Ascorbic Acid (VITAMIN C) 1000 MG tablet Take 1,000 mg by mouth daily.    . calcium carbonate 1250 MG capsule Take 1,250 mg by mouth 2 (two) times daily with a meal.    . Cholecalciferol (VITAMIN D PO) Take 5,000 Units by mouth daily.    . clonazePAM (KLONOPIN) 0.5 MG tablet Take 0.25-0.5 mg by mouth 2 (two) times daily as needed for anxiety.     Marland Kitchen estradiol (ESTRACE VAGINAL) 0.1 MG/GM vaginal cream Apply  vaginally as needed for vaginal dryness. 42.5 g 0  . hydrOXYzine (ATARAX/VISTARIL) 10 MG tablet Take 1 tablet (10 mg total) by mouth 2 (two) times daily as needed for anxiety. 30 tablet 0  . Omega-3 Fatty Acids (FISH OIL) 1000 MG CAPS Take 1,000 mg by mouth 3 (three) times daily.    . Plant Sterols and Stanols (CHOLEST OFF PO) Take 2 capsules by mouth daily.      Current Facility-Administered Medications on File Prior to Visit  Medication Dose Route Frequency Provider Last Rate Last Admin  . 0.9 %  sodium chloride infusion  500 mL Intravenous Continuous Nandigam, Kavitha V, MD      . betamethasone acetate-betamethasone sodium phosphate (CELESTONE) injection 12 mg  12 mg Intramuscular Once Daylene Katayama M, DPM      . betamethasone acetate-betamethasone sodium phosphate (CELESTONE) injection 12 mg  12 mg Intramuscular Once Daylene Katayama M, DPM        No Known Allergies  Objective:  General: Alert and oriented x3 in no acute distress  Dermatology: No open lesions bilateral lower extremities, no webspace macerations, no ecchymosis bilateral, all nails x 10 are well manicured.  Vascular: Dorsalis Pedis and Posterior Tibial pedal pulses palpable, Capillary Fill Time 3 seconds,(+) scant pedal hair growth bilateral, varicosities bilateral trace edema right greater than left ankle, Temperature gradient within normal limits.  Neurology: Gross sensation intact via light touch bilateral.  Musculoskeletal:  Moderate tenderness with palpation at sinus tarsi of the right foot and ankle.  There is significant structural post planovalgus deformity with rear foot valgus and forefoot abduction.  There is no pain to palpation currently to the PT tendon course for Achilles tendon most pain on today's exam on the right foot and ankle is over the sinus tarsi likely significant for progressive pes planus with lateral impingement.  Range of motion is severely limited at midtarsal joint, subtalar, and ankle joint due to  foot deformity right greater than left.  Gait: Antalgic gait  Assessment and Plan: Problem List Items Addressed This Visit   None   Visit Diagnoses    Tendonitis    -  Primary   Sinus tarsi syndrome of right ankle       Relevant Medications   triamcinolone acetonide (KENALOG) 10 MG/ML injection 10 mg   Pes planus of both feet       Arthritis of right foot       Relevant Medications   triamcinolone acetonide (KENALOG) 10 MG/ML injection 10 mg   Capsulitis           -Complete examination performed -Xrays reviewed and previous notes from other providers reviewed with changes consistent with progressive pes planus deformity -Discussed treatment options for right foot and ankle pain with no lateral impingement -After oral consent and aseptic prep, injected a mixture containing 1 ml of 2%  plain lidocaine, 1 ml 0.5% plain marcaine, 0.5 ml of kenalog 10 and 0.5 ml of dexamethasone phosphate into the ankle over the sinus tarsi without complication. Post-injection care discussed with patient.  -Ice rest and elevation  -Continue with good supportive shoes and use of over-the-counter brace -Discussed with patient importance of smoking sensation in order considered for any orthopedic surgery however at this time is not a surgical candidate -Patient to return to office if no better after a month or sooner if condition worsens.  Landis Martins, DPM

## 2022-01-17 NOTE — Progress Notes (Signed)
? ? ? ?01/18/2022 ?Suzanne Chandler ?[id number] ?1961/02/09 ? ? ?ASSESSMENT AND PLAN:  ? ?History of adenomatous polyp of colon ?We have discussed the risks of bleeding, infection, perforation, medication reactions, and remote risk of death associated with colonoscopy. All questions were answered and the patient acknowledges these risk and wishes to proceed. ?Had suprep last time with excellent bowel prep, will redo with this one, will start on miralax daily prior to help with constipation.  ? ?Tobacco abuse ?Discussed risks associated with tobacco use and advised to quit ?Information given to the patient ? ?Chronic idiopathic constipation ?- Increase fiber/ water intake, decrease caffeine, increase activity level. ?-Will add on Miralax daily and Benefiber ?- Please go to the hospital if you have severe abdominal pain, vomiting, fever, CP, SOB.  ? ? ? ?Patient Care Team: ?Nicoletta Dress, MD as PCP - General (Internal Medicine) ? ?HISTORY OF PRESENT ILLNESS: ?61 y.o. smoking female referred by Nicoletta Dress, MD, with a past medical history of bipolar disorder, hyperlipidemia, cystocele s/p partial hysterectomy and urethral sling, s/p cholecystectomy and others listed below presents for evaluation of constipation.  ?Patient known to Dr. Silverio Decamp. ?03/09/2017 colonoscopy excellent bowel prep diverticulosis, nonbleeding internal hemorrhoids, 4 tubular adenomatous polyps removed, 3-year recall. ?She has been taking care of her mom, 89, has RCC.  ? ?06/04/2018 negative cologuard ?[facility] patient okay.  ?She has BM 3-4 x a day very small volume.  ?She tried a friend lactulose that helped. Afraid to get addicted to it.  ?Has constipation has BM every 2-3 days, no blood in stool, no AB pain.  ?Denies GERD, dysphagia.  ? ?Current Medications:  ? ? ?Current Facility-Administered Medications (Endocrine & Metabolic):  ?  betamethasone acetate-betamethasone sodium phosphate (CELESTONE) injection 12 mg ?   betamethasone acetate-betamethasone sodium phosphate (CELESTONE) injection 12 mg ?  triamcinolone acetonide (KENALOG) 10 MG/ML injection 10 mg ? ? ? ? ? ? ? ? ? ?Current Outpatient Medications (Other):  ?  5-Hydroxytryptophan (5-HTP PO), Take by mouth. ?  Ascorbic Acid (VITAMIN C) 1000 MG tablet, Take 1,000 mg by mouth daily. ?  B Complex-Biotin-FA (B COMPLEX 100 TR PO),  ?  Cholecalciferol (VITAMIN D PO), Take 5,000 Units by mouth daily. ?  clonazePAM (KLONOPIN) 0.5 MG tablet, Take 0.25-0.5 mg by mouth 2 (two) times daily as needed for anxiety.  ?  estradiol (ESTRACE VAGINAL) 0.1 MG/GM vaginal cream, Apply vaginally as needed for vaginal dryness. ?  Omega-3 Fatty Acids (FISH OIL) 1000 MG CAPS, Take 1,000 mg by mouth 3 (three) times daily. ?  Plant Sterols and Stanols (CHOLEST OFF PO), Take 2 capsules by mouth daily.  ? ?Current Facility-Administered Medications (Other):  ?  0.9 %  sodium chloride infusion ? ?Medical History:  ?Past Medical History:  ?Diagnosis Date  ? Anxiety   ? Anxiety and depression   ? Arthritis   ? Bipolar disorder ([place])   ? Borderline diabetes   ? Colon polyps   ? Depression   ? Female bladder prolapse   ? Hyperlipemia   ? Urinary incontinence   ? Urinary tract infection   ? ?Allergies: No Known Allergies  ? ?Surgical History:  ?She  has a past surgical history that includes Cystocele repair (2015); Tubal ligation (1987); Abdominal hysterectomy (2014); Cholecystectomy (1985); Breast excisional biopsy (Right, 2012); and Appendectomy. ?Family History:  ?Her family history includes Anxiety disorder in her brother and sister; Arthritis in her sister; COPD in her sister; Depression in her brother  and mother; Diabetes in her mother; Heart attack in her father; Heart disease in her father; Hyperlipidemia in her mother; Hypertension in her mother; Ovarian cancer in her sister; Stroke in her father. ?Social History:  ? reports that she has been smoking cigarettes. She has a 33.00 pack-year smoking  history. She has never used smokeless tobacco. She reports that she does not drink alcohol and does not use drugs. ? ?REVIEW OF SYSTEMS  : All other systems reviewed and negative except where noted in the History of Present Illness. ? ? ?PHYSICAL EXAM: ?BP 110/68   Pulse 74   Ht '5\' 6"'$  (1.676 m)   Wt 188 lb 3.2 oz (85.4 kg)   SpO2 95%   BMI 30.38 kg/m?  ?General:   Pleasant, well developed female in no acute distress ?Head:  Normocephalic and atraumatic.Hoarseness ?Eyes: sclerae anicteric,conjunctive pink  ?Heart:  regular rate and rhythm ?Pulm: Clear anteriorly; no wheezing ?Abdomen:   Soft, Obese AB, Active bowel sounds. No tenderness . , No organomegaly appreciated. ?Extremities:  Without edema. ?Msk:  Symmetrical without gross deformities. Peripheral pulses intact.  ?Neurologic:  Alert and  oriented x4;  No focal deficits.  ?Skin:   Dry and intact without significant lesions or rashes. ?Psychiatric: Cooperative. Normal mood and affect. ? ? ? ?Vladimir Crofts, PA-C ?10:01 AM ? ? ?

## 2022-01-18 ENCOUNTER — Encounter: Payer: Self-pay | Admitting: Gastroenterology

## 2022-01-18 ENCOUNTER — Encounter: Payer: Self-pay | Admitting: Physician Assistant

## 2022-01-18 ENCOUNTER — Ambulatory Visit: Payer: Managed Care, Other (non HMO) | Admitting: Physician Assistant

## 2022-01-18 VITALS — BP 110/68 | HR 74 | Ht 66.0 in | Wt 188.2 lb

## 2022-01-18 DIAGNOSIS — Z720 Tobacco use: Secondary | ICD-10-CM | POA: Diagnosis not present

## 2022-01-18 DIAGNOSIS — Z86010 Personal history of colonic polyps: Secondary | ICD-10-CM

## 2022-01-18 DIAGNOSIS — K5904 Chronic idiopathic constipation: Secondary | ICD-10-CM | POA: Diagnosis not present

## 2022-01-18 MED ORDER — NA SULFATE-K SULFATE-MG SULF 17.5-3.13-1.6 GM/177ML PO SOLN: ORAL | 0 refills | Status: DC

## 2022-01-18 NOTE — Patient Instructions (Addendum)
Miralax is an osmotic laxative.  ?It only brings more water into the stool.  ?This is safe to take daily.  ?Can take up to 17 gram of miralax twice a day.  ?Mix with juice or coffee.  ?Start 1 capful at night for 3-4 days and reassess your response in 3-4 days.  ?You can increase and decrease the dose based on your response.  ?Remember, it can take up to 3-4 days to take effect OR for the effects to wear off.  ? ?You have been scheduled for a colonoscopy. Please follow written instructions given to you at your visit today.  ?Please pick up your prep supplies at the pharmacy within the next 1-3 days. ?If you use inhalers (even only as needed), please bring them with you on the day of your procedure.  ? ?I often pair this with benefiber in the morning to help assure the stool is not too loose.  ? ?Recommend increasing water and activity.  ?Get a squatty potty to use at home or try a stool, goal is to get your knees above your hips during a bowel movement. ? ?- Drink at least 64-80 ounces of water/liquid per day. ?- Establish a time to try to move your bowels every day.  For many people, this is after a cup of coffee or after a meal such as breakfast. ?- Sit all of the way back on the toilet keeping your back fairly straight and while sitting up, try to rest the tops of your forearms on your upper thighs.   ?- Raising your feet with a step stool/squatty potty can be helpful to improve the angle that allows your stool to pass through the rectum. ?- Relax the rectum feeling it bulge toward the toilet water.  If you feel your rectum raising toward your body, you are contracting rather than relaxing. ?- Breathe in and slowly exhale. "Belly breath" by expanding your belly towards your belly button. Keep belly expanded as you gently direct pressure down and back to the anus.  A low pitched GRRR sound can assist with increasing intra-abdominal pressure.  ?- Repeat 3-4 times. If unsuccessful, contract the pelvic floor to  restore normal tone and get off the toilet.  Avoid excessive straining. ?- To reduce excessive wiping by teaching your anus to normally contract, place hands on outer aspect of knees and resist knee movement outward.  Hold 5-10 second then place hands just inside of knees and resist inward movement of knees.  Hold 5 seconds.  Repeat a few times each way. ? ?Go to the ER if unable to pass gas, severe AB pain, unable to hold down food, any shortness of breath of chest pain.  ? ?Constipation, Adult ?Constipation is when a person has fewer than three bowel movements in a week, has difficulty having a bowel movement, or has stools (feces) that are dry, hard, or larger than normal. Constipation may be caused by an underlying condition. It may become worse with age if a person takes certain medicines and does not take in enough fluids. ?Follow these instructions at home: ?Eating and drinking ? ?Eat foods that have a lot of fiber, such as beans, whole grains, and fresh fruits and vegetables. ?Limit foods that are low in fiber and high in fat and processed sugars, such as fried or sweet foods. These include french fries, hamburgers, cookies, candies, and soda. ?Drink enough fluid to keep your urine pale yellow. ?General instructions ?Exercise regularly or as told by your  health care provider. Try to do 150 minutes of moderate exercise each week. ?Use the bathroom when you have the urge to go. Do not hold it in. ?Take over-the-counter and prescription medicines only as told by your health care provider. This includes any fiber supplements. ?During bowel movements: ?Practice deep breathing while relaxing the lower abdomen. ?Practice pelvic floor relaxation. ?Watch your condition for any changes. Let your health care provider know about them. ?Keep all follow-up visits as told by your health care provider. This is important. ?Contact a health care provider if: ?You have pain that gets worse. ?You have a fever. ?You do not have  a bowel movement after 4 days. ?You vomit. ?You are not hungry or you lose weight. ?You are bleeding from the opening between the buttocks (anus). ?You have thin, pencil-like stools. ?Get help right away if: ?You have a fever and your symptoms suddenly get worse. ?You leak stool or have blood in your stool. ?Your abdomen is bloated. ?You have severe pain in your abdomen. ?You feel dizzy or you faint. ?Summary ?Constipation is when a person has fewer than three bowel movements in a week, has difficulty having a bowel movement, or has stools (feces) that are dry, hard, or larger than normal. ?Eat foods that have a lot of fiber, such as beans, whole grains, and fresh fruits and vegetables. ?Drink enough fluid to keep your urine pale yellow. ?Take over-the-counter and prescription medicines only as told by your health care provider. This includes any fiber supplements. ?This information is not intended to replace advice given to you by your health care provider. Make sure you discuss any questions you have with your health care provider. ?Document Revised: 08/13/2019 Document Reviewed: 08/13/2019 ?Elsevier Patient Education ? [place]. ? ? ?SMOKING CESSATION ? ?American cancer society  ?73532992426 for more information or for a free program for smoking cessation help.  ? ?You can call QUIT SMART ?1-800-QUIT-NOW for free nicotine patches or replacement therapy- if they are out- keep calling ? ?[facility] ?Can call for smoking cessation classes, [phone] ? ?If you have a smart phone, please look up Smoke Free app, this will help you stay on track and give you information about money you have saved, life that you have gained back and a ton of more information.  ? ? ? ?ADVANTAGES OF QUITTING SMOKING ?Within 20 minutes, blood pressure decreases. Your pulse is at normal level. ?After 8 hours, carbon monoxide levels in the blood return to normal. Your oxygen level increases. ?After 24 hours, the  chance of having a heart attack starts to decrease. Your breath, hair, and body stop smelling like smoke. ?After 48 hours, damaged nerve endings begin to recover. Your sense of taste and smell improve. ?After 72 hours, the body is virtually free of nicotine. Your bronchial tubes relax and breathing becomes easier. ?After 2 to 12 weeks, lungs can hold more air. Exercise becomes easier and circulation improves. ?After 1 year, the risk of coronary heart disease is cut in half. ?After 5 years, the risk of stroke falls to the same as a nonsmoker. ?After 10 years, the risk of lung cancer is cut in half and the risk of other cancers decreases significantly. ?After 15 years, the risk of coronary heart disease drops, usually to the level of a nonsmoker. ?You will have extra money to spend on things other than cigarettes. ?Due to recent changes in healthcare laws, you may see the results of your imaging and laboratory  studies on MyChart before your provider has had a chance to review them.  We understand that in some cases there may be results that are confusing or concerning to you. Not all laboratory results come back in the same time frame and the provider may be waiting for multiple results in order to interpret others.  Please give [place] 48 hours in order for your provider to thoroughly review all the results before contacting the office for clarification of your results.   ? ?I appreciate the  opportunity to care for you ? ?Thank You  ? ?Amanda Collier,PA-C  ? ? ?

## 2022-01-25 ENCOUNTER — Encounter: Payer: Self-pay | Admitting: Gastroenterology

## 2022-01-25 ENCOUNTER — Ambulatory Visit (AMBULATORY_SURGERY_CENTER): Payer: Managed Care, Other (non HMO) | Admitting: Gastroenterology

## 2022-01-25 ENCOUNTER — Telehealth: Payer: Self-pay | Admitting: *Deleted

## 2022-01-25 VITALS — BP 108/85 | HR 61 | Temp 98.4°F | Resp 16 | Ht 66.0 in | Wt 188.0 lb

## 2022-01-25 DIAGNOSIS — Z86010 Personal history of colonic polyps: Secondary | ICD-10-CM

## 2022-01-25 DIAGNOSIS — D123 Benign neoplasm of transverse colon: Secondary | ICD-10-CM

## 2022-01-25 DIAGNOSIS — D125 Benign neoplasm of sigmoid colon: Secondary | ICD-10-CM

## 2022-01-25 DIAGNOSIS — D128 Benign neoplasm of rectum: Secondary | ICD-10-CM

## 2022-01-25 DIAGNOSIS — D122 Benign neoplasm of ascending colon: Secondary | ICD-10-CM | POA: Diagnosis not present

## 2022-01-25 DIAGNOSIS — D127 Benign neoplasm of rectosigmoid junction: Secondary | ICD-10-CM

## 2022-01-25 MED ORDER — SODIUM CHLORIDE 0.9 % IV SOLN: 500.0000 mL | Freq: Once | INTRAVENOUS | Status: DC

## 2022-01-25 NOTE — Progress Notes (Signed)
Sedate, gd SR, tolerated procedure well, VSS, report to RN 

## 2022-01-25 NOTE — Progress Notes (Signed)
Called to room to assist during endoscopic procedure.  Patient ID and intended procedure confirmed with present staff. Received instructions for my participation in the procedure from the performing physician.  

## 2022-01-25 NOTE — Progress Notes (Signed)
[facility] History and Physical ? ? ?Primary Care Physician:  Nicoletta Dress, MD ? ? ?Reason for Procedure:  History of adenomatous colon polyps ? ?Plan:    Surveillance colonoscopy with possible interventions as needed ? ? ? ? ?HPI: Suzanne Chandler is a very pleasant 61 y.o. female here for surveillance colonoscopy. ?Denies any nausea, vomiting, abdominal pain, melena or bright red blood per rectum ? ?The risks and benefits as well as alternatives of endoscopic procedure(s) have been discussed and reviewed. All questions answered. The patient agrees to proceed. ? ? ? ?Past Medical History:  ?Diagnosis Date  ? Anxiety   ? Anxiety and depression   ? Arthritis   ? Bipolar disorder ([place])   ? Borderline diabetes   ? Colon polyps   ? Depression   ? Female bladder prolapse   ? Hyperlipemia   ? Urinary incontinence   ? Urinary tract infection   ? ? ?Past Surgical History:  ?Procedure Laterality Date  ? ABDOMINAL HYSTERECTOMY  2014  ? APPENDECTOMY    ? BREAST EXCISIONAL BIOPSY Right 2012  ? neg  ? CHOLECYSTECTOMY  1985  ? CYSTOCELE REPAIR  2015  ? TUBAL LIGATION  1987  ? ? ?Prior to Admission medications   ?Medication Sig Start Date End Date Taking? Authorizing Provider  ?Omega-3 Fatty Acids (FISH OIL) 1000 MG CAPS Take 1,000 mg by mouth 3 (three) times daily.   Yes [provider]  ?5-Hydroxytryptophan (5-HTP PO) Take by mouth.    [provider]  ?Ascorbic Acid (VITAMIN C) 1000 MG tablet Take 1,000 mg by mouth daily.    [provider]  ?B Complex-Biotin-FA (B COMPLEX 100 TR PO)  07/25/18   [provider]  ?Cholecalciferol (VITAMIN D PO) Take 5,000 Units by mouth daily.    [provider]  ?clonazePAM (KLONOPIN) 0.5 MG tablet Take 0.25-0.5 mg by mouth 2 (two) times daily as needed for anxiety.     [provider]  ?estradiol (ESTRACE VAGINAL) 0.1 MG/GM vaginal cream Apply vaginally as needed for vaginal dryness. 01/29/18   Pleas Koch, NP   ?Plant Sterols and Stanols (CHOLEST OFF PO) Take 2 capsules by mouth daily.     [provider]  ? ? ?Current Outpatient Medications  ?Medication Sig Dispense Refill  ? Omega-3 Fatty Acids (FISH OIL) 1000 MG CAPS Take 1,000 mg by mouth 3 (three) times daily.    ? 5-Hydroxytryptophan (5-HTP PO) Take by mouth.    ? Ascorbic Acid (VITAMIN C) 1000 MG tablet Take 1,000 mg by mouth daily.    ? B Complex-Biotin-FA (B COMPLEX 100 TR PO)     ? Cholecalciferol (VITAMIN D PO) Take 5,000 Units by mouth daily.    ? clonazePAM (KLONOPIN) 0.5 MG tablet Take 0.25-0.5 mg by mouth 2 (two) times daily as needed for anxiety.     ? estradiol (ESTRACE VAGINAL) 0.1 MG/GM vaginal cream Apply vaginally as needed for vaginal dryness. 42.5 g 0  ? Plant Sterols and Stanols (CHOLEST OFF PO) Take 2 capsules by mouth daily.     ? ?Current Facility-Administered Medications  ?Medication Dose Route Frequency Provider Last Rate Last Admin  ? 0.9 %  sodium chloride infusion  500 mL Intravenous Continuous Mikhayla Phillis V, MD      ? 0.9 %  sodium chloride infusion  500 mL Intravenous Once Miroslava Santellan, Venia Minks, MD      ? ? ?Allergies as of 01/25/2022  ? (No Known Allergies)  ? ? ?  Family History  ?Problem Relation Age of Onset  ? Ovarian cancer Sister   ? Arthritis Sister   ? COPD Sister   ? Anxiety disorder Sister   ? Hyperlipidemia Mother   ? Hypertension Mother   ? Diabetes Mother   ? Depression Mother   ? Heart disease Father   ? Stroke Father   ? Heart attack Father   ? Depression Brother   ? Anxiety disorder Brother   ? Colon cancer Neg Hx   ? Esophageal cancer Neg Hx   ? Rectal cancer Neg Hx   ? Stomach cancer Neg Hx   ? ? ?Social History  ? ?Socioeconomic History  ? Marital status: Divorced  ?  Spouse name: Not on file  ? Number of children: 3  ? Years of education: Not on file  ? Highest education level: Not on file  ?Occupational History  ? Not on file  ?Tobacco Use  ? Smoking status: Every Day  ?  Packs/day: 1.00  ?  Years: 33.00   ?  Pack years: 33.00  ?  Types: Cigarettes  ? Smokeless tobacco: Never  ?Vaping Use  ? Vaping Use: Never used  ?Substance and Sexual Activity  ? Alcohol use: No  ? Drug use: No  ? Sexual activity: Not on file  ?Other Topics Concern  ? Not on file  ?Social History Narrative  ? Widow  ? Works in Therapist, art.  ? Highest level of education is GED.  ? Enjoys spending time with family.  ? ?Social Determinants of Health  ? ?Financial Resource Strain: Not on file  ?Food Insecurity: Not on file  ?Transportation Needs: Not on file  ?Physical Activity: Not on file  ?Stress: Not on file  ?Social Connections: Not on file  ?Intimate Partner Violence: Not on file  ? ? ?Review of Systems: ? ?All other review of systems negative except as mentioned in the HPI. ? ?Physical Exam: ?Vital signs in last 24 hours: ?BP 94/63   Pulse 73   Temp 98.4 ?F (36.9 ?C)   Ht '5\' 6"'$  (1.676 m)   Wt 188 lb (85.3 kg)   SpO2 97%   BMI 30.34 kg/m?  ?General:   Alert, NAD ?Lungs:  Clear .   ?Heart:  Regular rate and rhythm ?Abdomen:  Soft, nontender and nondistended. ?Neuro/Psych:  Alert and cooperative. Normal mood and affect. A and O x 3 ? ?Reviewed labs, radiology imaging, old records and pertinent past GI work up ? ?Patient is appropriate for planned procedure(s) and anesthesia in an ambulatory setting ? ? ?K. Denzil Magnuson , MD ?[phone]  ? ? ?  ?

## 2022-01-25 NOTE — Progress Notes (Signed)
Vs by Allie in adm ? ?Pt's states no medical or surgical changes since previsit or office visit.  ?

## 2022-01-25 NOTE — Patient Instructions (Signed)
Handouts on polyps, diverticulosis, and hemorrhoids given to you today  ? ?YOU HAD AN ENDOSCOPIC PROCEDURE TODAY AT [facility]:   Refer to the procedure report that was given to you for any specific questions about what was found during the examination.  If the procedure report does not answer your questions, please call your gastroenterologist to clarify.  If you requested that your care partner not be given the details of your procedure findings, then the procedure report has been included in a sealed envelope for you to review at your convenience later. ? ?YOU SHOULD EXPECT: Some feelings of bloating in the abdomen. Passage of more gas than usual.  Walking can help get rid of the air that was put into your GI tract during the procedure and reduce the bloating. If you had a lower endoscopy (such as a colonoscopy or flexible sigmoidoscopy) you may notice spotting of blood in your stool or on the toilet paper. If you underwent a bowel prep for your procedure, you may not have a normal bowel movement for a few days. ? ?Please Note:  You might notice some irritation and congestion in your nose or some drainage.  This is from the oxygen used during your procedure.  There is no need for concern and it should clear up in a day or so. ? ?SYMPTOMS TO REPORT IMMEDIATELY: ? ?Following lower endoscopy (colonoscopy or flexible sigmoidoscopy): ? Excessive amounts of blood in the stool ? Significant tenderness or worsening of abdominal pains ? Swelling of the abdomen that is new, acute ? Fever of 100?F or higher ? ?For urgent or emergent issues, a gastroenterologist can be reached at any hour by calling [phone]. ?Do not use MyChart messaging for urgent concerns.  ? ? ?DIET:  We do recommend a small meal at first, but then you may proceed to your regular diet.  Drink plenty of fluids but you should avoid alcoholic beverages for 24 hours. ? ?ACTIVITY:  You should plan to take it easy for the rest of today  and you should NOT DRIVE or use heavy machinery until tomorrow (because of the sedation medicines used during the test).   ? ?FOLLOW UP: ?Our staff will call the number listed on your records 48-72 hours following your procedure to check on you and address any questions or concerns that you may have regarding the information given to you following your procedure. If we do not reach you, we will leave a message.  We will attempt to reach you two times.  During this call, we will ask if you have developed any symptoms of COVID 19. If you develop any symptoms (ie: fever, flu-like symptoms, shortness of breath, cough etc.) before then, please call [phone].  If you test positive for Covid 19 in the 2 weeks post procedure, please call and report this information to [place].   ? ?If any biopsies were taken you will be contacted by phone or by letter within the next 1-3 weeks.  Please call us at [phone] if you have not heard about the biopsies in 3 weeks.  ? ? ?SIGNATURES/CONFIDENTIALITY: ?You and/or your care partner have signed paperwork which will be entered into your electronic medical record.  These signatures attest to the fact that that the information above on your After Visit Summary has been reviewed and is understood.  Full responsibility of the confidentiality of this discharge information lies with you and/or your care-partner.  ?

## 2022-01-25 NOTE — Op Note (Signed)
[place] ?Patient Name: Suzanne Chandler ?Procedure Date: 01/25/2022 8:21 AM ?MRN: [id number] ?Endoscopist: Mauri Pole , MD ?Age: 61 ?Referring MD:  ?Date of Birth: [birth year] ?Gender: Female ?Account #: [id number] ?Procedure:                Colonoscopy ?Indications:              High risk colon cancer surveillance: Personal  ?                          history of colonic polyps, High risk colon cancer  ?                          surveillance: Personal history of multiple (3 or  ?                          more) adenomas ?Medicines:                Monitored Anesthesia Care ?Procedure:                Pre-Anesthesia Assessment: ?                          - Prior to the procedure, a History and Physical  ?                          was performed, and patient medications and  ?                          allergies were reviewed. The patient's tolerance of  ?                          previous anesthesia was also reviewed. The risks  ?                          and benefits of the procedure and the sedation  ?                          options and risks were discussed with the patient.  ?                          All questions were answered, and informed consent  ?                          was obtained. Prior Anticoagulants: The patient has  ?                          taken no previous anticoagulant or antiplatelet  ?                          agents. ASA Grade Assessment: II - A patient with  ?                          mild systemic disease. After reviewing the risks  ?  and benefits, the patient was deemed in  ?                          satisfactory condition to undergo the procedure. ?                          After obtaining informed consent, the colonoscope  ?                          was passed under direct vision. Throughout the  ?                          procedure, the patient's blood pressure, pulse, and  ?                          oxygen saturations were monitored  continuously. The  ?                          Olympus PCF-H190DL ([id number]) Colonoscope was  ?                          introduced through the anus and advanced to the the  ?                          cecum, identified by appendiceal orifice and  ?                          ileocecal valve. The colonoscopy was performed  ?                          without difficulty. The patient tolerated the  ?                          procedure well. The quality of the bowel  ?                          preparation was good. The ileocecal valve,  ?                          appendiceal orifice, and rectum were photographed. ?Scope In: 8:37:35 AM ?Scope Out: 8:57:20 AM ?Scope Withdrawal Time: 0 hours 9 minutes 59 seconds  ?Total Procedure Duration: 0 hours 19 minutes 45 seconds  ?Findings:                 The perianal and digital rectal examinations were  ?                          normal. ?                          Five sessile polyps were found in the rectum,  ?                          sigmoid colon, transverse colon and ascending  ?  colon. The polyps were 3 to 9 mm in size. These  ?                          polyps were removed with a cold snare. Resection  ?                          and retrieval were complete. ?                          A less than 1 mm polyp was found in the ascending  ?                          colon. The polyp was sessile. The polyp was removed  ?                          with a cold biopsy forceps. Resection and retrieval  ?                          were complete. ?                          A few small-mouthed diverticula were found in the  ?                          sigmoid colon and ascending colon. ?                          Non-bleeding external and internal hemorrhoids were  ?                          found during retroflexion. The hemorrhoids were  ?                          medium-sized. ?                          The exam was otherwise without abnormality. ?Complications:             No immediate complications. ?Estimated Blood Loss:     Estimated blood loss was minimal. ?Impression:               - Five 3 to 9 mm polyps in the rectum, in the  ?                          sigmoid colon, in the transverse colon and in the  ?                          ascending colon, removed with a cold snare.  ?                          Resected and retrieved. ?                          - One less than 1 mm polyp in the ascending colon,  ?  removed with a cold biopsy forceps. Resected and  ?                          retrieved. ?                          - Diverticulosis in the sigmoid colon and in the  ?                          ascending colon. ?                          - Non-bleeding external and internal hemorrhoids. ?                          - The examination was otherwise normal. ?Recommendation:           - Patient has a contact number available for  ?                          emergencies. The signs and symptoms of potential  ?                          delayed complications were discussed with the  ?                          patient. Return to normal activities tomorrow.  ?                          Written discharge instructions were provided to the  ?                          patient. ?                          - Resume previous diet. ?                          - Continue present medications. ?                          - Await pathology results. ?                          - Repeat colonoscopy in 3 - 5 years for  ?                          surveillance based on pathology results. ?Mauri Pole, MD ?01/25/2022 9:08:51 AM ?This report has been signed electronically. ?

## 2022-01-27 ENCOUNTER — Telehealth: Payer: Self-pay

## 2022-01-27 NOTE — Telephone Encounter (Signed)
?  Follow up Call- ? ? ?  01/25/2022  ?  7:29 AM  ?Call back number  ?Post procedure Call Back phone  # [phone]  ?Permission to leave phone message Yes  ?  ? ?Patient questions: ? ?Do you have a fever, pain , or abdominal swelling? No. ?Pain Score  0 * ? ?Have you tolerated food without any problems? Yes.   ? ?Have you been able to return to your normal activities? Yes.   ? ?Do you have any questions about your discharge instructions: ?Diet   No. ?Medications  No. ?Follow up visit  No. ? ?Do you have questions or concerns about your Care? No. ? ?Actions: ?* If pain score is 4 or above: ?No action needed, pain <4. ? ? ?

## 2022-02-03 ENCOUNTER — Encounter: Payer: Self-pay | Admitting: Gastroenterology

## 2022-02-15 ENCOUNTER — Other Ambulatory Visit: Payer: Self-pay | Admitting: Family Medicine

## 2022-02-15 DIAGNOSIS — Z87891 Personal history of nicotine dependence: Secondary | ICD-10-CM

## 2022-02-21 ENCOUNTER — Other Ambulatory Visit: Payer: Self-pay | Admitting: Family Medicine

## 2022-02-21 DIAGNOSIS — Z1231 Encounter for screening mammogram for malignant neoplasm of breast: Secondary | ICD-10-CM

## 2022-02-21 DIAGNOSIS — E2839 Other primary ovarian failure: Secondary | ICD-10-CM

## 2022-02-28 ENCOUNTER — Ambulatory Visit
Admission: RE | Admit: 2022-02-28 | Discharge: 2022-02-28 | Disposition: A | Discharge status: Home / Self Care | Payer: Commercial Managed Care - HMO | Source: Ambulatory Visit | Attending: Family Medicine | Admitting: Family Medicine

## 2022-02-28 DIAGNOSIS — Z87891 Personal history of nicotine dependence: Secondary | ICD-10-CM

## 2022-03-02 NOTE — Progress Notes (Signed)
Reviewed and agree with documentation and assessment and plan. K. Veena Aarib Pulido , MD   

## 2022-07-19 NOTE — Telephone Encounter (Signed)
error 

## 2024-02-12 ENCOUNTER — Encounter (HOSPITAL_BASED_OUTPATIENT_CLINIC_OR_DEPARTMENT_OTHER): Payer: Self-pay

## 2024-02-12 ENCOUNTER — Ambulatory Visit (HOSPITAL_BASED_OUTPATIENT_CLINIC_OR_DEPARTMENT_OTHER): Admitting: Student

## 2024-02-25 ENCOUNTER — Ambulatory Visit: Admitting: Family Medicine

## 2024-02-26 ENCOUNTER — Ambulatory Visit: Admitting: Internal Medicine

## 2024-02-26 ENCOUNTER — Encounter: Payer: Self-pay | Admitting: Internal Medicine

## 2024-02-26 VITALS — BP 110/64 | HR 86 | Temp 98.0°F | Ht 66.0 in | Wt 196.2 lb

## 2024-02-26 DIAGNOSIS — E669 Obesity, unspecified: Secondary | ICD-10-CM

## 2024-02-26 DIAGNOSIS — Z720 Tobacco use: Secondary | ICD-10-CM

## 2024-02-26 DIAGNOSIS — R7303 Prediabetes: Secondary | ICD-10-CM | POA: Diagnosis not present

## 2024-02-26 DIAGNOSIS — E559 Vitamin D deficiency, unspecified: Secondary | ICD-10-CM

## 2024-02-26 DIAGNOSIS — Z124 Encounter for screening for malignant neoplasm of cervix: Secondary | ICD-10-CM

## 2024-02-26 DIAGNOSIS — F419 Anxiety disorder, unspecified: Secondary | ICD-10-CM | POA: Diagnosis not present

## 2024-02-26 DIAGNOSIS — E785 Hyperlipidemia, unspecified: Secondary | ICD-10-CM

## 2024-02-26 DIAGNOSIS — F32A Depression, unspecified: Secondary | ICD-10-CM | POA: Diagnosis not present

## 2024-02-26 DIAGNOSIS — Z1231 Encounter for screening mammogram for malignant neoplasm of breast: Secondary | ICD-10-CM

## 2024-02-26 NOTE — Progress Notes (Signed)
 [facility] [address] [place] [address] Dept: [phone] Dept Fax: [phone]  New Patient Office Visit  Subjective:   Suzanne Chandler 06-Mar-1961 02/26/2024  Chief Complaint  Patient presents with   Establish Care    HPI: Suzanne Chandler presents today to establish care at [facility]. Introduced to Publishing rights manager role and practice setting.  All questions answered.  Concerns: See below   Discussed the use of AI scribe software for clinical note transcription with the patient, who gave verbal consent to proceed.  History of Present Illness   Suzanne Chandler is a 63 year old female who presents to establish care.  She experiences significant stress and headaches, attributing them to family-related stress and caregiving responsibilities for her mother over the past six years. Her nerves feel 'shot', and she has a history of stress-related issues following her husband's death 28 years ago. She occasionally uses gamma?? supplements.  Previously, she used clonazepam after her husband's death but discontinued it due to concerns about dependency.  She does not currently take this at this time.  She has a history of high cholesterol, managed with plant stanol and sterol supplements, as well as fish oil. She takes 1000 mg of fish oil and 1800 mg of plant supplements daily and is not on any prescription medications for her cholesterol.  She is not fasting today.  She has prediabetes but is not currently on any medication for it. She is concerned about her weight, noting it is the highest it has ever been, which she attributes to emotional eating over the past five to six months. Her diet includes high sugar intake, such as sodas and ice cream.   She has been smoking since 1984, starting after the birth of her son, and has recently increased her smoking to two packs a day due to  stress. Typically, she smokes one to one and a half packs a day. She has had a CT scan for lung cancer screening in 2023, is currently due for repeat screening.  She is also due for her mammogram.      02/26/2024    3:31 PM 04/24/2017   10:13 AM  Depression screen PHQ 2/9  Decreased Interest 0 0  Down, Depressed, Hopeless 1 2  PHQ - 2 Score 1 2  Altered sleeping 2 1  Tired, decreased energy 0 2  Change in appetite 3 0  Feeling bad or failure about yourself  1 2  Trouble concentrating 0 1  Moving slowly or fidgety/restless 1 1  Suicidal thoughts 0 0  PHQ-9 Score 8 9  Difficult doing work/chores Somewhat difficult       02/26/2024    3:32 PM  GAD 7 : Generalized Anxiety Score  Nervous, Anxious, on Edge 3  Control/stop worrying 3  Worry too much - different things 3  Trouble relaxing 3  Restless 1  Easily annoyed or irritable 3  Afraid - awful might happen 1  Total GAD 7 Score 17  Anxiety Difficulty Somewhat difficult       The following portions of the patient's history were reviewed and updated as appropriate: past medical history, past surgical history, family history, social history, allergies, medications, and problem list.   Patient Active Problem List   Diagnosis Date Noted   Chronic shoulder pain 01/29/2018   Recurrent UTI 01/29/2018   Tobacco abuse 01/29/2018   Prediabetes 01/16/2017   Vitamin D  deficiency 01/16/2017   Hyperlipidemia  08/29/2016   Anxiety and depression 08/29/2016   Past Medical History:  Diagnosis Date   Anxiety    Anxiety and depression    Arthritis    Bipolar disorder (HCC)    Borderline diabetes    Colon polyps    Depression    Female bladder prolapse    Hyperlipemia    Urinary incontinence    Urinary tract infection    Past Surgical History:  Procedure Laterality Date   ABDOMINAL HYSTERECTOMY  2014   APPENDECTOMY     BREAST EXCISIONAL BIOPSY Right 2012   neg   CHOLECYSTECTOMY  1985   CYSTOCELE REPAIR  2015   TUBAL  LIGATION  1987   Family History  Problem Relation Age of Onset   Ovarian cancer Sister    Arthritis Sister    COPD Sister    Anxiety disorder Sister    Hyperlipidemia Mother    Hypertension Mother    Diabetes Mother    Depression Mother    Heart disease Father    Stroke Father    Heart attack Father    Depression Brother    Anxiety disorder Brother    Colon cancer Neg Hx    Esophageal cancer Neg Hx    Rectal cancer Neg Hx    Stomach cancer Neg Hx     Current Outpatient Medications:    5-Hydroxytryptophan (5-HTP PO), Take by mouth., Disp: , Rfl:    Ascorbic Acid (VITAMIN C) 1000 MG tablet, Take 1,000 mg by mouth daily., Disp: , Rfl:    B Complex-Biotin-FA (B COMPLEX 100 TR PO), , Disp: , Rfl:    Cholecalciferol (VITAMIN D  PO), Take 5,000 Units by mouth daily., Disp: , Rfl:    cholecalciferol (VITAMIN D3) 25 MCG (1000 UNIT) tablet, Take 1,000 Units by mouth daily., Disp: , Rfl:    estradiol  (ESTRACE  VAGINAL) 0.1 MG/GM vaginal cream, Apply vaginally as needed for vaginal dryness., Disp: 42.5 g, Rfl: 0   magnesium citrate solution, Take 1 tablet by mouth., Disp: , Rfl:    Omega-3 Fatty Acids (FISH OIL) 1000 MG CAPS, Take 1,000 mg by mouth 3 (three) times daily., Disp: , Rfl:    Omega-3 Fatty Acids (SUPER OMEGA 3 EPA/DHA) 1000 MG CAPS, Take 3 g by mouth., Disp: , Rfl:    Plant Sterols and Stanols (CHOLEST OFF PO), Take 2 capsules by mouth daily. , Disp: , Rfl:  No Known Allergies  ROS: A complete ROS was performed with pertinent positives/negatives noted in the HPI. The remainder of the ROS are negative.   Objective:   Today's Vitals   02/26/24 1454  BP: 110/64  Pulse: 86  Temp: 98 F (36.7 C)  TempSrc: Temporal  SpO2: 97%  Weight: 196 lb 3.2 oz (89 kg)  Height: 5\' 6"  (1.676 m)    GENERAL: Well-appearing, in NAD. Well nourished.  SKIN: Pink, warm and dry. No rash, lesion, ulceration, or ecchymoses.  NECK: Trachea midline. Full ROM w/o pain or tenderness. No  lymphadenopathy.  RESPIRATORY: Chest wall symmetrical. Respirations even and non-labored. Breath sounds clear to auscultation bilaterally.  CARDIAC: S1, S2 present, regular rate and rhythm. Peripheral pulses 2+ bilaterally.  EXTREMITIES: Without clubbing, cyanosis, or edema.  NEUROLOGIC: No motor or sensory deficits. Steady, even gait.  PSYCH/MENTAL STATUS: Alert, oriented x 3. Cooperative, appropriate mood and affect.   Health Maintenance Due  Topic Date Due   Pneumococcal Vaccine 32-18 Years old (1 of 2 - PCV) Never done   Cervical Cancer Screening (HPV/Pap  Cotest)  Never done   Zoster Vaccines- Shingrix (1 of 2) Never done   MAMMOGRAM  02/27/2019   DTaP/Tdap/Td (2 - Td or Tdap) 05/14/2021   Lung Cancer Screening  03/01/2023   COVID-19 Vaccine (1 - 2024-25 season) Never done    No results found for any visits on 02/26/24.  Assessment & Plan:  Assessment and Plan    Tobacco use disorder Chronic tobacco use since 1984, currently smoking up to two packs per day. Aware of risks and desires to quit. - Order CT lung cancer screening at [facility]. - Discuss smoking cessation options, including a program found by her sister and the use of nicotine patches.  Obesity Increased weight due to emotional eating and high intake of sugary foods. Desires dietary changes. - Provide information about the Mediterranean diet.  Prediabetes Prediabetes, requires monitoring of blood glucose levels. - Order fasting blood work to check A1c level.  Hyperlipidemia Managed with plant stanol and sterol supplements and fish oil currently per patient. - Order fasting lipid panel. - Continue fish oil supplement. - may need statin therapy depending on lab results.   Vitamin D  deficiency Currently taking vitamin D3 supplements. - Order blood work to check vitamin D  level.  Breast cancer screening Due for mammogram. History of fibrocystic changes. - Order mammogram and consider ultrasound if  indicated.  Anxiety and depression  - Patient will continue her OTC supplements PRN  General health maintenance Routine health maintenance screenings are due. - Order cervical cancer screening with referral to OBGYN.       Orders Placed This Encounter  Procedures   CT CHEST LUNG CANCER SCREENING LOW DOSE WO CONTRAST    Standing Status:   Future    Expiration Date:   02/25/2025    Preferred Imaging Location?:   [facility]   MM 3D SCREENING MAMMOGRAM BILATERAL BREAST    Standing Status:   Future    Expiration Date:   02/25/2025    Reason for Exam (SYMPTOM  OR DIAGNOSIS REQUIRED):   screening for breast cancer    Preferred imaging location?:   [facility]   Hemoglobin A1C    Standing Status:   Future    Expiration Date:   02/25/2025   VITAMIN D  25 Hydroxy (Vit-D Deficiency, Fractures)    Standing Status:   Future    Expiration Date:   02/25/2025   Basic Metabolic Panel (BMET)    Standing Status:   Future    Expiration Date:   02/25/2025   Lipid panel    Standing Status:   Future    Expiration Date:   02/25/2025   Ambulatory referral to Obstetrics / Gynecology    Referral Priority:   Routine    Referral Type:   Consultation    Referral Reason:   Specialty Services Required    Requested Specialty:   Obstetrics and Gynecology    Number of Visits Requested:   1   No orders of the defined types were placed in this encounter.   Return in about 4 months (around 06/28/2024) for Cholesterol, Diabetes, vitamin d  deficiency, health maintenance - fasting labs at appointment.   Gavin Kast, FNP

## 2024-02-27 ENCOUNTER — Encounter: Payer: Self-pay | Admitting: Family Medicine

## 2024-02-27 ENCOUNTER — Telehealth: Payer: Self-pay

## 2024-02-27 NOTE — Telephone Encounter (Signed)
 She has to do a screening mammogram first , then if needed, the radiologist will determine if she needs a diagnostic mammogram with ultrasound.

## 2024-02-27 NOTE — Telephone Encounter (Signed)
 Patient is asking that mammogram referral includes a ultrasound of breast because of past imaging results   Copied from CRM [id number]. Topic: Referral - Question >> Feb 27, 2024  2:20 PM Aisha D wrote: Reason for CRM: Pt stated that she has an referral to get a mammogram completed for her breast. Pt stated that she would also need for Gavin Kast, FNP, to send a referral to have an ultrasound completed after the mammogram.

## 2024-03-05 ENCOUNTER — Ambulatory Visit
Admission: RE | Admit: 2024-03-05 | Discharge: 2024-03-05 | Disposition: A | Discharge status: Home / Self Care | Source: Ambulatory Visit | Attending: Internal Medicine | Admitting: Internal Medicine

## 2024-03-05 ENCOUNTER — Other Ambulatory Visit

## 2024-03-05 ENCOUNTER — Other Ambulatory Visit (INDEPENDENT_AMBULATORY_CARE_PROVIDER_SITE_OTHER)

## 2024-03-05 DIAGNOSIS — R7303 Prediabetes: Secondary | ICD-10-CM | POA: Diagnosis not present

## 2024-03-05 DIAGNOSIS — E559 Vitamin D deficiency, unspecified: Secondary | ICD-10-CM

## 2024-03-05 DIAGNOSIS — Z122 Encounter for screening for malignant neoplasm of respiratory organs: Secondary | ICD-10-CM | POA: Diagnosis not present

## 2024-03-05 DIAGNOSIS — E785 Hyperlipidemia, unspecified: Secondary | ICD-10-CM

## 2024-03-05 DIAGNOSIS — Z87891 Personal history of nicotine dependence: Secondary | ICD-10-CM | POA: Diagnosis not present

## 2024-03-05 DIAGNOSIS — Z720 Tobacco use: Secondary | ICD-10-CM

## 2024-03-05 LAB — LIPID PANEL
Cholesterol: 171 mg/dL (ref 0–200)
HDL: 27.2 mg/dL — ABNORMAL LOW (ref >39.00)
LDL Cholesterol: 85 mg/dL (ref 0–99)
NonHDL: 143.76
Total CHOL/HDL Ratio: 6
Triglycerides: 295 mg/dL — ABNORMAL HIGH (ref 0.0–149.0)
VLDL: 59 mg/dL — ABNORMAL HIGH (ref 0.0–40.0)

## 2024-03-05 LAB — BASIC METABOLIC PANEL WITH GFR
BUN: 12 mg/dL (ref 6–23)
CO2: 28 meq/L (ref 19–32)
Calcium: 9.9 mg/dL (ref 8.4–10.5)
Chloride: 104 meq/L (ref 96–112)
Creatinine, Ser: 0.75 mg/dL (ref 0.40–1.20)
GFR: 85.15 mL/min (ref >60.00)
Glucose, Bld: 117 mg/dL — ABNORMAL HIGH (ref 70–99)
Potassium: 4.1 meq/L (ref 3.5–5.1)
Sodium: 139 meq/L (ref 135–145)

## 2024-03-05 LAB — HEMOGLOBIN A1C: Hgb A1c MFr Bld: 6.5 % (ref 4.6–6.5)

## 2024-03-05 LAB — VITAMIN D 25 HYDROXY (VIT D DEFICIENCY, FRACTURES): VITD: 28.21 ng/mL — ABNORMAL LOW (ref 30.00–100.00)

## 2024-03-06 ENCOUNTER — Telehealth: Payer: Self-pay

## 2024-03-06 ENCOUNTER — Encounter: Payer: Self-pay | Admitting: Internal Medicine

## 2024-03-06 ENCOUNTER — Ambulatory Visit: Admitting: Internal Medicine

## 2024-03-06 VITALS — BP 120/68 | HR 75 | Temp 98.1°F | Ht 66.0 in | Wt 194.6 lb

## 2024-03-06 DIAGNOSIS — D229 Melanocytic nevi, unspecified: Secondary | ICD-10-CM

## 2024-03-06 DIAGNOSIS — Z85828 Personal history of other malignant neoplasm of skin: Secondary | ICD-10-CM

## 2024-03-06 DIAGNOSIS — N952 Postmenopausal atrophic vaginitis: Secondary | ICD-10-CM

## 2024-03-06 MED ORDER — ESTRADIOL 0.1 MG/GM VA CREA: 1.0000 | TOPICAL_CREAM | Freq: Every evening | VAGINAL | 12 refills | Status: DC | PRN

## 2024-03-06 NOTE — Progress Notes (Signed)
 [facility] [address] [place] [address] Dept: [phone] Dept Fax: [phone]  Acute Care Office Visit  Subjective:   Suzanne Chandler November 21, 1960 03/06/2024  Chief Complaint  Patient presents with   Referral   Medication Refill   Smoking Cessation Screening    HPI:  Suzanne Chandler I a 63 yo F who presents for concerns of atypical mole. Has been there for several months. No itching, scaly, scabbing, redness. Has increased in size. Has had hx of basal cell carcinoma.   Patient also needs refill of estrace  cream for vaginal dryness.    The following portions of the patient's history were reviewed and updated as appropriate: past medical history, past surgical history, family history, social history, allergies, medications, and problem list.   Patient Active Problem List   Diagnosis Date Noted   Chronic shoulder pain 01/29/2018   Recurrent UTI 01/29/2018   Tobacco abuse 01/29/2018   Prediabetes 01/16/2017   Vitamin D  deficiency 01/16/2017   Hyperlipidemia 08/29/2016   Anxiety and depression 08/29/2016   Past Medical History:  Diagnosis Date   Anxiety    Anxiety and depression    Arthritis    Bipolar disorder (HCC)    Borderline diabetes    Colon polyps    Depression    Female bladder prolapse    Hyperlipemia    Urinary incontinence    Urinary tract infection    Past Surgical History:  Procedure Laterality Date   ABDOMINAL HYSTERECTOMY  2014   APPENDECTOMY     BREAST EXCISIONAL BIOPSY Right 2012   neg   CHOLECYSTECTOMY  1985   CYSTOCELE REPAIR  2015   TUBAL LIGATION  1987   Family History  Problem Relation Age of Onset   Ovarian cancer Sister    Arthritis Sister    COPD Sister    Anxiety disorder Sister    Hyperlipidemia Mother    Hypertension Mother    Diabetes Mother    Depression Mother    Heart disease Father    Stroke Father    Heart attack Father    Depression  Brother    Anxiety disorder Brother    Colon cancer Neg Hx    Esophageal cancer Neg Hx    Rectal cancer Neg Hx    Stomach cancer Neg Hx     Current Outpatient Medications:    estradiol  (ESTRACE  VAGINAL) 0.1 MG/GM vaginal cream, Place 1 Applicatorful vaginally at bedtime as needed (vaginal dryness)., Disp: 42.5 g, Rfl: 12   5-Hydroxytryptophan (5-HTP PO), Take by mouth., Disp: , Rfl:    Ascorbic Acid (VITAMIN C) 1000 MG tablet, Take 1,000 mg by mouth daily., Disp: , Rfl:    B Complex-Biotin-FA (B COMPLEX 100 TR PO), , Disp: , Rfl:    Cholecalciferol (VITAMIN D  PO), Take 5,000 Units by mouth daily., Disp: , Rfl:    cholecalciferol (VITAMIN D3) 25 MCG (1000 UNIT) tablet, Take 1,000 Units by mouth daily., Disp: , Rfl:    magnesium citrate solution, Take 1 tablet by mouth., Disp: , Rfl:    Omega-3 Fatty Acids (FISH OIL) 1000 MG CAPS, Take 1,000 mg by mouth 3 (three) times daily., Disp: , Rfl:    Omega-3 Fatty Acids (SUPER OMEGA 3 EPA/DHA) 1000 MG CAPS, Take 3 g by mouth., Disp: , Rfl:    Plant Sterols and Stanols (CHOLEST OFF PO), Take 2 capsules by mouth daily. , Disp: , Rfl:  No Known Allergies   ROS: A  complete ROS was performed with pertinent positives/negatives noted in the HPI. The remainder of the ROS are negative.    Objective:   Today's Vitals   03/06/24 1307  BP: 120/68  Pulse: 75  Temp: 98.1 F (36.7 C)  TempSrc: Temporal  SpO2: 99%  Weight: 194 lb 9.6 oz (88.3 kg)  Height: 5\' 6"  (1.676 m)    GENERAL: Well-appearing, in NAD. Well nourished.  SKIN: Pink, warm and dry. < 0.5cm even colored brown raised  smooth nevus with well circumscribed boarders to left temporal region of head.  RESPIRATORY: Respirations even and non-labored. EXTREMITIES: Without clubbing, cyanosis, or edema.  NEUROLOGIC:  Steady, even gait.  PSYCH/MENTAL STATUS: Alert, oriented x 3. Cooperative, appropriate mood and affect.    No results found for any visits on 03/06/24.    Assessment & Plan:   1. Vaginal atrophy (Primary) - estradiol  (ESTRACE  VAGINAL) 0.1 MG/GM vaginal cream; Place 1 Applicatorful vaginally at bedtime as needed (vaginal dryness).  Dispense: 42.5 g; Refill: 12  2. Hx of basal cell carcinoma - Ambulatory referral to Dermatology  3. Atypical mole - Ambulatory referral to Dermatology   Meds ordered this encounter  Medications   estradiol  (ESTRACE  VAGINAL) 0.1 MG/GM vaginal cream    Sig: Place 1 Applicatorful vaginally at bedtime as needed (vaginal dryness).    Dispense:  42.5 g    Refill:  12    Supervising Provider:   Catheryn Cluck [[id number]]   Orders Placed This Encounter  Procedures   Ambulatory referral to Dermatology    Referral Priority:   Routine    Referral Type:   Consultation    Referral Reason:   Specialty Services Required    Requested Specialty:   Dermatology    Number of Visits Requested:   1   Lab Orders  No laboratory test(s) ordered today   No images are attached to the encounter or orders placed in the encounter.  Return in about 4 months (around 07/07/2024) for Cholesterol, Diabetes, vitamin d  deficiency, health maintenance.   Suzanne Kast, FNP

## 2024-03-06 NOTE — Telephone Encounter (Signed)
 Please advise   Copied from CRM [id number]. Topic: Clinical - Medication Question >> Mar 06, 2024  3:36 PM Clydene Darner H wrote: Reason for CRM: Meghna from [facility] called stating they do not compound or carry the manufactured version of the estradiol  prescription. She is inquiring if the prescription can be sent to [facility] instead.  [phone]

## 2024-03-07 ENCOUNTER — Other Ambulatory Visit: Payer: Self-pay | Admitting: Internal Medicine

## 2024-03-07 ENCOUNTER — Telehealth: Payer: Self-pay | Admitting: Internal Medicine

## 2024-03-07 DIAGNOSIS — N952 Postmenopausal atrophic vaginitis: Secondary | ICD-10-CM

## 2024-03-07 MED ORDER — ESTRADIOL 0.1 MG/GM VA CREA: 1.0000 | TOPICAL_CREAM | Freq: Every evening | VAGINAL | 12 refills | Status: DC | PRN

## 2024-03-07 NOTE — Telephone Encounter (Signed)
 That is fine, please fax prescription to patient's preferred location.  Please clarify which [facility] she wants it to go to [facility] or [place]) both locations are listed on her chart.

## 2024-03-07 NOTE — Telephone Encounter (Signed)
 Contacted patient and informed her of message from pharmacy.  Patient will call pharmacy and get them to fax the office the name of Rx she is requesting.

## 2024-03-07 NOTE — Telephone Encounter (Signed)
 Copied from CRM [id number]. Topic: General - Other >> Mar 07, 2024  2:48 PM [place] E wrote: Reason for CRM: Patient stated [facility] sent a fax over to the office for patient's estradiol  (ESTRACE  VAGINAL) 0.1 MG/GM vaginal cream in regards to the medication directions that need clarification. Fax number for [facility] is [phone].

## 2024-03-10 ENCOUNTER — Other Ambulatory Visit (HOSPITAL_COMMUNITY)
Admission: RE | Admit: 2024-03-10 | Discharge: 2024-03-10 | Disposition: A | Discharge status: Home / Self Care | Source: Ambulatory Visit | Attending: Certified Nurse Midwife | Admitting: Certified Nurse Midwife

## 2024-03-10 ENCOUNTER — Encounter: Payer: Self-pay | Admitting: Certified Nurse Midwife

## 2024-03-10 ENCOUNTER — Ambulatory Visit (INDEPENDENT_AMBULATORY_CARE_PROVIDER_SITE_OTHER): Admitting: Certified Nurse Midwife

## 2024-03-10 VITALS — BP 111/79 | HR 76 | Resp 16 | Ht 66.0 in | Wt 198.3 lb

## 2024-03-10 DIAGNOSIS — Z124 Encounter for screening for malignant neoplasm of cervix: Secondary | ICD-10-CM

## 2024-03-10 DIAGNOSIS — R32 Unspecified urinary incontinence: Secondary | ICD-10-CM

## 2024-03-10 DIAGNOSIS — Z01419 Encounter for gynecological examination (general) (routine) without abnormal findings: Secondary | ICD-10-CM | POA: Diagnosis not present

## 2024-03-10 DIAGNOSIS — F17200 Nicotine dependence, unspecified, uncomplicated: Secondary | ICD-10-CM

## 2024-03-10 MED ORDER — NICOTINE 21 MG/24HR TD PT24: MEDICATED_PATCH | TRANSDERMAL | 0 refills | Status: DC

## 2024-03-10 MED ORDER — NICOTINE 14 MG/24HR TD PT24: MEDICATED_PATCH | TRANSDERMAL | 0 refills | Status: DC

## 2024-03-10 MED ORDER — NICOTINE 7 MG/24HR TD PT24: MEDICATED_PATCH | TRANSDERMAL | 0 refills | Status: DC

## 2024-03-10 NOTE — Progress Notes (Signed)
 ANNUAL PREVENTATIVE CARE GYNECOLOGY  ENCOUNTER NOTE  Subjective:       Suzanne Chandler is a 63 y.o. G35P3003 female here for a routine annual gynecologic exam. The patient is not currently sexually active. The patient is taking hormone replacement therapy. Patient denies post-menopausal vaginal bleeding. The patient wears seatbelts: yes. The patient participates in regular exercise: yes. Has the patient ever been transfused or tattooed?: no. The patient reports that there is not domestic violence in her life.  Current complaints: 1.  She has been having urinary incontinence since 2014. She wears depends to help with incontinence. She has leaking when cough and laughing, and can not make it to the bathroom to urinate in time. She has to position herself a certain way when she urinates to make sure her bladder is empty.     Gynecologic History No LMP recorded. Patient has had a hysterectomy. Contraception: status post hysterectomy Last Pap: unknown. Results were: normal Last mammogram: 02/26/2018. Results were: normal Last Colonoscopy: Due April 2026 Last Dexa Scan: Not age appropriate   Obstetric History OB History  Gravida Para Term Preterm AB Living  3 3 3   3   SAB IAB Ectopic Multiple Live Births          # Outcome Date GA Lbr Len/2nd Weight Sex Type Anes PTL Lv  3 Term           2 Term           1 Term             Past Medical History:  Diagnosis Date   Anxiety    Anxiety and depression    Arthritis    Bipolar disorder (HCC)    Borderline diabetes    Colon polyps    Depression    Female bladder prolapse    Hyperlipemia    Urinary incontinence    Urinary tract infection     Family History  Problem Relation Age of Onset   Ovarian cancer Sister    Arthritis Sister    COPD Sister    Anxiety disorder Sister    Hyperlipidemia Mother    Hypertension Mother    Diabetes Mother    Depression Mother    Heart disease Father    Stroke Father    Heart attack  Father    Depression Brother    Anxiety disorder Brother    Colon cancer Neg Hx    Esophageal cancer Neg Hx    Rectal cancer Neg Hx    Stomach cancer Neg Hx     Past Surgical History:  Procedure Laterality Date   ABDOMINAL HYSTERECTOMY  2014   APPENDECTOMY     BREAST EXCISIONAL BIOPSY Right 2012   neg   CHOLECYSTECTOMY  1985   CYSTOCELE REPAIR  2015   TUBAL LIGATION  1987    Social History   Socioeconomic History   Marital status: Divorced    Spouse name: Not on file   Number of children: 3   Years of education: Not on file   Highest education level: GED or equivalent  Occupational History   Not on file  Tobacco Use   Smoking status: Every Day    Current packs/day: 1.00    Average packs/day: 1 pack/day for 33.0 years (33.0 ttl pk-yrs)    Types: Cigarettes   Smokeless tobacco: Never  Vaping Use   Vaping status: Never Used  Substance and Sexual Activity   Alcohol use: No   Drug  use: No   Sexual activity: Not Currently    Birth control/protection: Surgical  Other Topics Concern   Not on file  Social History Narrative   Widow   Works in Clinical biochemist.   Highest level of education is GED.   Enjoys spending time with family.   Social Drivers of Corporate investment banker Strain: Low Risk  (02/10/2024)   Overall Financial Resource Strain (CARDIA)    Difficulty of Paying Living Expenses: Not hard at all  Food Insecurity: No Food Insecurity (02/10/2024)   Hunger Vital Sign    Worried About Running Out of Food in the Last Year: Never true    Ran Out of Food in the Last Year: Never true  Transportation Needs: No Transportation Needs (02/10/2024)   PRAPARE - Administrator, Civil Service (Medical): No    Lack of Transportation (Non-Medical): No  Physical Activity: Unknown (02/10/2024)   Exercise Vital Sign    Days of Exercise per Week: Patient declined    Minutes of Exercise per Session: Not on file  Stress: No Stress Concern Present (02/10/2024)    [employer] of Occupational Health - Occupational Stress Questionnaire    Feeling of Stress : Only a little  Social Connections: Unknown (02/10/2024)   Social Connection and Isolation Panel [NHANES]    Frequency of Communication with Friends and Family: More than three times a week    Frequency of Social Gatherings with Friends and Family: Once a week    Attends Religious Services: Patient declined    Database administrator or Organizations: No    Attends Engineer, structural: Not on file    Marital Status: Patient declined  Intimate Partner Violence: Not on file    Current Outpatient Medications on File Prior to Visit  Medication Sig Dispense Refill   5-Hydroxytryptophan (5-HTP PO) Take by mouth.     Ascorbic Acid (VITAMIN C) 1000 MG tablet Take 1,000 mg by mouth daily.     B Complex-Biotin-FA (B COMPLEX 100 TR PO)      Cholecalciferol (VITAMIN D  PO) Take 5,000 Units by mouth daily.     cholecalciferol (VITAMIN D3) 25 MCG (1000 UNIT) tablet Take 1,000 Units by mouth daily.     estradiol  (ESTRACE  VAGINAL) 0.1 MG/GM vaginal cream Place 1 Applicatorful vaginally at bedtime as needed (vaginal dryness). 42.5 g 12   magnesium citrate solution Take 1 tablet by mouth.     Omega-3 Fatty Acids (FISH OIL) 1000 MG CAPS Take 1,000 mg by mouth 3 (three) times daily.     Omega-3 Fatty Acids (SUPER OMEGA 3 EPA/DHA) 1000 MG CAPS Take 3 g by mouth.     Plant Sterols and Stanols (CHOLEST OFF PO) Take 2 capsules by mouth daily.      No current facility-administered medications on file prior to visit.    No Known Allergies    Review of Systems ROS Review of Systems - General ROS: negative for - chills, fatigue, fever, hot flashes, night sweats, weight gain or weight loss Psychological ROS: negative for - anxiety, decreased libido, depression, mood swings, physical abuse or sexual abuse Ophthalmic ROS: negative for - blurry vision, eye pain or loss of vision ENT ROS: negative for -  headaches, hearing change, visual changes or vocal changes Allergy and Immunology ROS: negative for - hives, itchy/watery eyes or seasonal allergies Hematological and Lymphatic ROS: negative for - bleeding problems, bruising, swollen lymph nodes or weight loss Endocrine ROS: negative for -  galactorrhea, hair pattern changes, hot flashes, malaise/lethargy, mood swings, palpitations, polydipsia/polyuria, skin changes, temperature intolerance or unexpected weight changes Breast ROS: negative for - new or changing breast lumps or nipple discharge Respiratory ROS: negative for - cough or shortness of breath Cardiovascular ROS: negative for - chest pain, irregular heartbeat, palpitations or shortness of breath Gastrointestinal ROS: no abdominal pain, change in bowel habits, or black or bloody stools Genito-Urinary ROS: no dysuria, trouble voiding, or hematuria Musculoskeletal ROS: negative for - joint pain or joint stiffness Neurological ROS: negative for - bowel and bladder control changes Dermatological ROS: negative for rash and skin lesion changes   Objective:   BP 111/79   Pulse 76   Resp 16   Ht 5\' 6"  (1.676 m)   Wt 89.9 kg   BMI 32.01 kg/m  CONSTITUTIONAL: Well-developed, well-nourished female in no acute distress.  PSYCHIATRIC: Normal mood and affect. Normal behavior. Normal judgment and thought content. NEUROLGIC: Alert and oriented to person, place, and time. Normal muscle tone coordination. No cranial nerve deficit noted. HENT:  Normocephalic, atraumatic EYES: Conjunctivae and EOM are normal. Pupils are equal, round, and reactive to light. No scleral icterus.  NECK: Normal range of motion, supple, no masses.  Normal thyroid .  SKIN: Skin is warm and dry. No rash noted. Not diaphoretic. No erythema. No pallor. CARDIOVASCULAR: Normal heart rate noted, regular rhythm, no murmur. RESPIRATORY: Clear to auscultation bilaterally. Effort and breath sounds normal, no problems with  respiration noted. BREASTS: Symmetric in size. No masses, skin changes, nipple drainage, or lymphadenopathy.  ABDOMEN: Soft, normal bowel sounds, no distention noted.  No tenderness, rebound or guarding.  BLADDER: non- distended.  PELVIC:  Bladder no bladder distension noted  Urethra: not indicated  Vulva: normal appearing vulva with no masses, tenderness or lesions  Vagina: normal appearing vagina with normal color and discharge, no lesions  Cervix: normal appearing cervix without discharge or lesions  Uterus: uterus is normal size, shape, consistency and nontender  Adnexa: not indicated  RV: External Exam NormaI, rash from Depends noted on skin perianally.  MUSCULOSKELETAL: Normal range of motion. No tenderness.  No cyanosis, clubbing, or edema.  2+ distal pulses. LYMPHATIC: No Axillary, Supraclavicular, or Inguinal Adenopathy.   Labs: No results found for: "WBC", "HGB", "HCT", "MCV", "PLT"  Lab Results  Component Value Date   CREATININE 0.75 03/05/2024   BUN 12 03/05/2024   NA 139 03/05/2024   K 4.1 03/05/2024   CL 104 03/05/2024   CO2 28 03/05/2024    Lab Results  Component Value Date   ALT 11 01/23/2018   AST 11 01/23/2018   ALKPHOS 65 01/23/2018   BILITOT 0.4 01/23/2018    Lab Results  Component Value Date   CHOL 171 03/05/2024   HDL 27.20 (L) 03/05/2024   LDLCALC 85 03/05/2024   LDLDIRECT 139.0 01/23/2018   TRIG 295.0 (H) 03/05/2024   CHOLHDL 6 03/05/2024    Lab Results  Component Value Date   TSH 1.41 01/16/2017    Lab Results  Component Value Date   HGBA1C 6.5 03/05/2024     Assessment:   1. Encounter for well woman exam with routine gynecological exam   2. Cervical cancer screening   3. Smoker   4. Urinary incontinence, unspecified type      Plan:  1.Routine preventative health maintenance measures emphasized: Exercise/Diet/Weight control, Tobacco Warnings, Alcohol/Substance use risks, Stress Management, Peer Pressure Issues, and Safe  Sex Pap: collected. Mammogram: Ordered Colon Screening:  UTD Labs: UTD by  PCP 3. Smoking cessation discussed, Nicotine patch's ordered and sent to pharmacy.  4. Discussed scheduling with doctors to discuss pessary placement, Uro/gyn referral sent for incontinence and bladder sling assessment. Discussed using A&D ointment on skin before wearing depends to help prevent skin breakdown.   Return to Clinic - 1 Year  Wayna Hails [facility]

## 2024-03-10 NOTE — Progress Notes (Deleted)
 ANNUAL PREVENTATIVE CARE GYNECOLOGY  ENCOUNTER NOTE  Subjective:       Suzanne Chandler is a 63 y.o. No obstetric history on file. female here for a routine annual gynecologic exam. The patient is not currently sexually active. The patient is taking hormone replacement therapy. Patient denies post-menopausal vaginal bleeding. The patient wears seatbelts: yes. The patient participates in regular exercise: yes. Has the patient ever been transfused or tattooed?: no. The patient reports that there is not domestic violence in her life.  Current complaints: 1.  She has been having urinary incontinence since 2014. She wears pads to help with incontinence. She has to position herself a certain way when she urinates to make sure her bladder is empty.     Gynecologic History No LMP recorded. Patient has had a hysterectomy. Contraception: status post hysterectomy Last Pap: unknown. Results were: normal Last mammogram: 02/26/2018. Results were: normal Last Colonoscopy: Due April 2026 Last Dexa Scan: Not age appropriate   Obstetric History OB History  No obstetric history on file.    Past Medical History:  Diagnosis Date   Anxiety    Anxiety and depression    Arthritis    Bipolar disorder (HCC)    Borderline diabetes    Colon polyps    Depression    Female bladder prolapse    Hyperlipemia    Urinary incontinence    Urinary tract infection     Family History  Problem Relation Age of Onset   Ovarian cancer Sister    Arthritis Sister    COPD Sister    Anxiety disorder Sister    Hyperlipidemia Mother    Hypertension Mother    Diabetes Mother    Depression Mother    Heart disease Father    Stroke Father    Heart attack Father    Depression Brother    Anxiety disorder Brother    Colon cancer Neg Hx    Esophageal cancer Neg Hx    Rectal cancer Neg Hx    Stomach cancer Neg Hx     Past Surgical History:  Procedure Laterality Date   ABDOMINAL HYSTERECTOMY  2014    APPENDECTOMY     BREAST EXCISIONAL BIOPSY Right 2012   neg   CHOLECYSTECTOMY  1985   CYSTOCELE REPAIR  2015   TUBAL LIGATION  1987    Social History   Socioeconomic History   Marital status: Divorced    Spouse name: Not on file   Number of children: 3   Years of education: Not on file   Highest education level: GED or equivalent  Occupational History   Not on file  Tobacco Use   Smoking status: Every Day    Current packs/day: 1.00    Average packs/day: 1 pack/day for 33.0 years (33.0 ttl pk-yrs)    Types: Cigarettes   Smokeless tobacco: Never  Vaping Use   Vaping status: Never Used  Substance and Sexual Activity   Alcohol use: No   Drug use: No   Sexual activity: Not on file  Other Topics Concern   Not on file  Social History Narrative   Widow   Works in Clinical biochemist.   Highest level of education is GED.   Enjoys spending time with family.   Social Drivers of Corporate investment banker Strain: Low Risk  (02/10/2024)   Overall Financial Resource Strain (CARDIA)    Difficulty of Paying Living Expenses: Not hard at all  Food Insecurity: No Food Insecurity (02/10/2024)  Hunger Vital Sign    Worried About Running Out of Food in the Last Year: Never true    Ran Out of Food in the Last Year: Never true  Transportation Needs: No Transportation Needs (02/10/2024)   PRAPARE - Administrator, Civil Service (Medical): No    Lack of Transportation (Non-Medical): No  Physical Activity: Unknown (02/10/2024)   Exercise Vital Sign    Days of Exercise per Week: Patient declined    Minutes of Exercise per Session: Not on file  Stress: No Stress Concern Present (02/10/2024)   [employer] of Occupational Health - Occupational Stress Questionnaire    Feeling of Stress : Only a little  Social Connections: Unknown (02/10/2024)   Social Connection and Isolation Panel [NHANES]    Frequency of Communication with Friends and Family: More than three times a week     Frequency of Social Gatherings with Friends and Family: Once a week    Attends Religious Services: Patient declined    Database administrator or Organizations: No    Attends Engineer, structural: Not on file    Marital Status: Patient declined  Intimate Partner Violence: Not on file    Current Outpatient Medications on File Prior to Visit  Medication Sig Dispense Refill   5-Hydroxytryptophan (5-HTP PO) Take by mouth.     Ascorbic Acid (VITAMIN C) 1000 MG tablet Take 1,000 mg by mouth daily.     B Complex-Biotin-FA (B COMPLEX 100 TR PO)      Cholecalciferol (VITAMIN D  PO) Take 5,000 Units by mouth daily.     cholecalciferol (VITAMIN D3) 25 MCG (1000 UNIT) tablet Take 1,000 Units by mouth daily.     estradiol  (ESTRACE  VAGINAL) 0.1 MG/GM vaginal cream Place 1 Applicatorful vaginally at bedtime as needed (vaginal dryness). 42.5 g 12   magnesium citrate solution Take 1 tablet by mouth.     Omega-3 Fatty Acids (FISH OIL) 1000 MG CAPS Take 1,000 mg by mouth 3 (three) times daily.     Omega-3 Fatty Acids (SUPER OMEGA 3 EPA/DHA) 1000 MG CAPS Take 3 g by mouth.     Plant Sterols and Stanols (CHOLEST OFF PO) Take 2 capsules by mouth daily.      No current facility-administered medications on file prior to visit.    No Known Allergies    Review of Systems ROS Review of Systems - General ROS: negative for - chills, fatigue, fever, hot flashes, night sweats, weight gain or weight loss Psychological ROS: negative for - anxiety, decreased libido, depression, mood swings, physical abuse or sexual abuse Ophthalmic ROS: negative for - blurry vision, eye pain or loss of vision ENT ROS: negative for - headaches, hearing change, visual changes or vocal changes Allergy and Immunology ROS: negative for - hives, itchy/watery eyes or seasonal allergies Hematological and Lymphatic ROS: negative for - bleeding problems, bruising, swollen lymph nodes or weight loss Endocrine ROS: negative for -  galactorrhea, hair pattern changes, hot flashes, malaise/lethargy, mood swings, palpitations, polydipsia/polyuria, skin changes, temperature intolerance or unexpected weight changes Breast ROS: negative for - new or changing breast lumps or nipple discharge Respiratory ROS: negative for - cough or shortness of breath Cardiovascular ROS: negative for - chest pain, irregular heartbeat, palpitations or shortness of breath Gastrointestinal ROS: no abdominal pain, change in bowel habits, or black or bloody stools Genito-Urinary ROS: no dysuria, trouble voiding, or hematuria Musculoskeletal ROS: negative for - joint pain or joint stiffness Neurological ROS: negative for - bowel  and bladder control changes Dermatological ROS: negative for rash and skin lesion changes   Objective:   BP 111/79   Pulse 76   Resp 16   Ht 5\' 6"  (1.676 m)   Wt 198 lb 4.8 oz (89.9 kg)   BMI 32.01 kg/m  CONSTITUTIONAL: Well-developed, well-nourished female in no acute distress.  PSYCHIATRIC: Normal mood and affect. Normal behavior. Normal judgment and thought content. NEUROLGIC: Alert and oriented to person, place, and time. Normal muscle tone coordination. No cranial nerve deficit noted. HENT:  Normocephalic, atraumatic, External right and left ear normal. Oropharynx is clear and moist EYES: Conjunctivae and EOM are normal. Pupils are equal, round, and reactive to light. No scleral icterus.  NECK: Normal range of motion, supple, no masses.  Normal thyroid .  SKIN: Skin is warm and dry. No rash noted. Not diaphoretic. No erythema. No pallor. CARDIOVASCULAR: Normal heart rate noted, regular rhythm, no murmur. RESPIRATORY: Clear to auscultation bilaterally. Effort and breath sounds normal, no problems with respiration noted. BREASTS: Symmetric in size. No masses, skin changes, nipple drainage, or lymphadenopathy. ABDOMEN: Soft, normal bowel sounds, no distention noted.  No tenderness, rebound or guarding.  BLADDER:  Normal PELVIC:  Bladder {:311640}  Urethra: {:311719}  Vulva: {:311722}  Vagina: {:311643}  Cervix: {:311644}  Uterus: {:311718}  Adnexa: {:311645}  RV: {Blank multiple:19196::"External Exam NormaI","No Rectal Masses","Normal Sphincter tone"}  MUSCULOSKELETAL: Normal range of motion. No tenderness.  No cyanosis, clubbing, or edema.  2+ distal pulses. LYMPHATIC: No Axillary, Supraclavicular, or Inguinal Adenopathy.   Labs: No results found for: "WBC", "HGB", "HCT", "MCV", "PLT"  Lab Results  Component Value Date   CREATININE 0.75 03/05/2024   BUN 12 03/05/2024   NA 139 03/05/2024   K 4.1 03/05/2024   CL 104 03/05/2024   CO2 28 03/05/2024    Lab Results  Component Value Date   ALT 11 01/23/2018   AST 11 01/23/2018   ALKPHOS 65 01/23/2018   BILITOT 0.4 01/23/2018    Lab Results  Component Value Date   CHOL 171 03/05/2024   HDL 27.20 (L) 03/05/2024   LDLCALC 85 03/05/2024   LDLDIRECT 139.0 01/23/2018   TRIG 295.0 (H) 03/05/2024   CHOLHDL 6 03/05/2024    Lab Results  Component Value Date   TSH 1.41 01/16/2017    Lab Results  Component Value Date   HGBA1C 6.5 03/05/2024     Assessment:   1. Encounter for well woman exam with routine gynecological exam   2. Encounter for screening mammogram for malignant neoplasm of breast      Plan:  Pap: Not needed Mammogram: Ordered Colon Screening:  UTD Labs: UTD by PCP Routine preventative health maintenance measures emphasized: Exercise/Diet/Weight control, Tobacco Warnings, Alcohol/Substance use risks, Stress Management, Peer Pressure Issues, and Safe Sex Flu vaccine status: COVID Vaccination status: Return to Clinic - 1 Year   Donato Fu, [facility]

## 2024-03-10 NOTE — Patient Instructions (Signed)
 Preventive Care 39-63 Years Old, Female Preventive care refers to lifestyle choices and visits with your health care provider that can promote health and wellness. Preventive care visits are also called wellness exams. What can I expect for my preventive care visit? Counseling Your health care provider may ask you questions about your: Medical history, including: Past medical problems. Family medical history. Pregnancy history. Current health, including: Menstrual cycle. Method of birth control. Emotional well-being. Home life and relationship well-being. Sexual activity and sexual health. Lifestyle, including: Alcohol, nicotine or tobacco, and drug use. Access to firearms. Diet, exercise, and sleep habits. Work and work Astronomer. Sunscreen use. Safety issues such as seatbelt and bike helmet use. Physical exam Your health care provider will check your: Height and weight. These may be used to calculate your BMI (body mass index). BMI is a measurement that tells if you are at a healthy weight. Waist circumference. This measures the distance around your waistline. This measurement also tells if you are at a healthy weight and may help predict your risk of certain diseases, such as type 2 diabetes and high blood pressure. Heart rate and blood pressure. Body temperature. Skin for abnormal spots. What immunizations do I need?  Vaccines are usually given at various ages, according to a schedule. Your health care provider will recommend vaccines for you based on your age, medical history, and lifestyle or other factors, such as travel or where you work. What tests do I need? Screening Your health care provider may recommend screening tests for certain conditions. This may include: Lipid and cholesterol levels. Diabetes screening. This is done by checking your blood sugar (glucose) after you have not eaten for a while (fasting). Pelvic exam and Pap test. Hepatitis B test. Hepatitis C  test. HIV (human immunodeficiency virus) test. STI (sexually transmitted infection) testing, if you are at risk. Lung cancer screening. Colorectal cancer screening. Mammogram. Talk with your health care provider about when you should start having regular mammograms. This may depend on whether you have a family history of breast cancer. BRCA-related cancer screening. This may be done if you have a family history of breast, ovarian, tubal, or peritoneal cancers. Bone density scan. This is done to screen for osteoporosis. Talk with your health care provider about your test results, treatment options, and if necessary, the need for more tests. Follow these instructions at home: Eating and drinking  Eat a diet that includes fresh fruits and vegetables, whole grains, lean protein, and low-fat dairy products. Take vitamin and mineral supplements as recommended by your health care provider. Do not drink alcohol if: Your health care provider tells you not to drink. You are pregnant, may be pregnant, or are planning to become pregnant. If you drink alcohol: Limit how much you have to 0-1 drink a day. Know how much alcohol is in your drink. In the [place] one drink equals one 12 oz bottle of beer (355 mL), one 5 oz glass of wine (148 mL), or one 1 oz glass of hard liquor (44 mL). Lifestyle Brush your teeth every morning and night with fluoride toothpaste. Floss one time each day. Exercise for at least 30 minutes 5 or more days each week. Do not use any products that contain nicotine or tobacco. These products include cigarettes, chewing tobacco, and vaping devices, such as e-cigarettes. If you need help quitting, ask your health care provider. Do not use drugs. If you are sexually active, practice safe sex. Use a condom or other form of protection to  prevent STIs. If you do not wish to become pregnant, use a form of birth control. If you plan to become pregnant, see your health care provider for a  prepregnancy visit. Take aspirin only as told by your health care provider. Make sure that you understand how much to take and what form to take. Work with your health care provider to find out whether it is safe and beneficial for you to take aspirin daily. Find healthy ways to manage stress, such as: Meditation, yoga, or listening to music. Journaling. Talking to a trusted person. Spending time with friends and family. Minimize exposure to UV radiation to reduce your risk of skin cancer. Safety Always wear your seat belt while driving or riding in a vehicle. Do not drive: If you have been drinking alcohol. Do not ride with someone who has been drinking. When you are tired or distracted. While texting. If you have been using any mind-altering substances or drugs. Wear a helmet and other protective equipment during sports activities. If you have firearms in your house, make sure you follow all gun safety procedures. Seek help if you have been physically or sexually abused. What's next? Visit your health care provider once a year for an annual wellness visit. Ask your health care provider how often you should have your eyes and teeth checked. Stay up to date on all vaccines. This information is not intended to replace advice given to you by your health care provider. Make sure you discuss any questions you have with your health care provider. Document Revised: 03/23/2021 Document Reviewed: 03/23/2021 Elsevier Patient Education  2024 [employer] Breast Self-Awareness Breast self-awareness is knowing how your breasts look and feel. You need to: Check your breasts on a regular basis. Tell your doctor about any changes. Become familiar with the look and feel of your breasts. This can help you catch a breast problem while it is still small and can be treated. You should do breast self-exams even if you have breast implants. What you need: A mirror. A well-lit room. A pillow or other  soft object. How to do a breast self-exam Follow these steps to do a breast self-exam: Look for changes  Take off all the clothes above your waist. Stand in front of a mirror in a room with good lighting. Put your hands down at your sides. Compare your breasts in the mirror. Look for any difference between them, such as: A difference in shape. A difference in size. Wrinkles, dips, and bumps in one breast and not the other. Look at each breast for changes in the skin, such as: Redness. Scaly areas. Skin that has gotten thicker. Dimpling. Open sores (ulcers). Look for changes in your nipples, such as: Fluid coming out of a nipple. Fluid around a nipple. Bleeding. Dimpling. Redness. A nipple that looks pushed in (retracted), or that has changed position. Feel for changes Lie on your back. Feel each breast. To do this: Pick a breast to feel. Place a pillow under the shoulder closest to that breast. Put the arm closest to that breast behind your head. Feel the nipple area of that breast using the hand of your other arm. Feel the area with the pads of your three middle fingers by making small circles with your fingers. Use light, medium, and firm pressure. Continue the overlapping circles, moving downward over the breast. Keep making circles with your fingers. Stop when you feel your ribs. Start making circles with your fingers again, this time going  upward until you reach your collarbone. Then, make circles outward across your breast and into your armpit area. Squeeze your nipple. Check for discharge and lumps. Repeat these steps to check your other breast. Sit or stand in the tub or shower. With soapy water on your skin, feel each breast the same way you did when you were lying down. Write down what you find Writing down what you find can help you remember what to tell your doctor. Write down: What is normal for each breast. Any changes you find in each breast. These  include: The kind of changes you find. A tender or painful breast. Any lump you find. Write down its size and where it is. When you last had your monthly period (menstrual cycle). General tips If you are breastfeeding, the best time to check your breasts is after you feed your baby or after you use a breast pump. If you get monthly bleeding, the best time to check your breasts is 5-7 days after your monthly cycle ends. With time, you will become comfortable with the self-exam. You will also start to know if there are changes in your breasts. Contact a doctor if: You see a change in the shape or size of your breasts or nipples. You see a change in the skin of your breast or nipples, such as red or scaly skin. You have fluid coming from your nipples that is not normal. You find a new lump or thick area. You have breast pain. You have any concerns about your breast health. Summary Breast self-awareness includes looking for changes in your breasts and feeling for changes within your breasts. You should do breast self-awareness in front of a mirror in a well-lit room. If you get monthly periods (menstrual cycles), the best time to check your breasts is 5-7 days after your period ends. Tell your doctor about any changes you see in your breasts. Changes include changes in size, changes on the skin, painful or tender breasts, or fluid from your nipples that is not normal. This information is not intended to replace advice given to you by your health care provider. Make sure you discuss any questions you have with your health care provider. Document Revised: 03/02/2022 Document Reviewed: 07/28/2021 Elsevier Patient Education  2024 [employer].

## 2024-03-10 NOTE — Telephone Encounter (Signed)
Pharmacy was contacted

## 2024-03-11 ENCOUNTER — Ambulatory Visit: Payer: Self-pay | Admitting: Internal Medicine

## 2024-03-11 DIAGNOSIS — E119 Type 2 diabetes mellitus without complications: Secondary | ICD-10-CM | POA: Insufficient documentation

## 2024-03-11 DIAGNOSIS — E118 Type 2 diabetes mellitus with unspecified complications: Secondary | ICD-10-CM | POA: Insufficient documentation

## 2024-03-11 NOTE — Addendum Note (Signed)
 Addended by: Donato Fu on: 03/11/2024 09:29 AM   Modules accepted: Level of Service

## 2024-03-12 ENCOUNTER — Other Ambulatory Visit: Payer: Self-pay | Admitting: Internal Medicine

## 2024-03-12 DIAGNOSIS — E119 Type 2 diabetes mellitus without complications: Secondary | ICD-10-CM

## 2024-03-12 MED ORDER — METFORMIN HCL ER 500 MG PO TB24: 500.0000 mg | ORAL_TABLET | Freq: Every day | ORAL | 2 refills | Status: DC

## 2024-03-12 MED ORDER — ESTRADIOL 0.1 MG/GM VA CREA: 1.0000 | TOPICAL_CREAM | Freq: Every evening | VAGINAL | 12 refills | Status: AC | PRN

## 2024-03-12 NOTE — Addendum Note (Signed)
 Addended by: Vallorie Gayer D on: 03/12/2024 03:50 PM   Modules accepted: Orders

## 2024-03-13 ENCOUNTER — Other Ambulatory Visit: Payer: Self-pay | Admitting: Certified Nurse Midwife

## 2024-03-13 ENCOUNTER — Ambulatory Visit: Payer: Self-pay | Admitting: Certified Nurse Midwife

## 2024-03-13 LAB — URINE CULTURE

## 2024-03-13 MED ORDER — NITROFURANTOIN MONOHYD MACRO 100 MG PO CAPS: 100.0000 mg | ORAL_CAPSULE | Freq: Two times a day (BID) | ORAL | 1 refills | Status: DC

## 2024-03-14 ENCOUNTER — Telehealth: Payer: Self-pay

## 2024-03-14 ENCOUNTER — Other Ambulatory Visit: Payer: Self-pay

## 2024-03-14 DIAGNOSIS — R32 Unspecified urinary incontinence: Secondary | ICD-10-CM

## 2024-03-14 MED ORDER — NITROFURANTOIN MONOHYD MACRO 100 MG PO CAPS: 100.0000 mg | ORAL_CAPSULE | Freq: Two times a day (BID) | ORAL | 1 refills | Status: DC

## 2024-03-14 NOTE — Telephone Encounter (Signed)
 Spoke with pt to confirm correct pharmacy. Rx sent in to [facility] in [place]

## 2024-03-18 DIAGNOSIS — Z1231 Encounter for screening mammogram for malignant neoplasm of breast: Secondary | ICD-10-CM | POA: Diagnosis not present

## 2024-03-18 LAB — HM MAMMOGRAPHY

## 2024-03-18 LAB — CYTOLOGY - PAP
Adequacy: ABSENT
Comment: NEGATIVE
Diagnosis: NEGATIVE
High risk HPV: NEGATIVE

## 2024-03-20 ENCOUNTER — Encounter: Payer: Self-pay | Admitting: Internal Medicine

## 2024-03-20 ENCOUNTER — Ambulatory Visit: Payer: Self-pay | Admitting: Internal Medicine

## 2024-03-21 ENCOUNTER — Telehealth: Payer: Self-pay | Admitting: Internal Medicine

## 2024-03-21 NOTE — Telephone Encounter (Signed)
 Suzanne Chandler (Patient)   Subject  Suzanne Chandler (Patient)   Topic  Referral - Question    Communication  Reason for CRM: patient was wondering if referral for Dermatology could be changed. She called around and was able to get in somewhere else sooner as current referral couldn't get her in until October.  This provider didn't specifically say they needed a referral but she wants one made/sent just in case. [facility] [address] [place] phone: [phone] fax: [phone]. Patient got an appointment for this coming Tuesday June 17th.

## 2024-03-25 DIAGNOSIS — Z79899 Other long term (current) drug therapy: Secondary | ICD-10-CM | POA: Diagnosis not present

## 2024-03-25 DIAGNOSIS — R7303 Prediabetes: Secondary | ICD-10-CM | POA: Diagnosis not present

## 2024-03-25 DIAGNOSIS — R229 Localized swelling, mass and lump, unspecified: Secondary | ICD-10-CM | POA: Diagnosis not present

## 2024-03-25 DIAGNOSIS — Z6831 Body mass index (BMI) 31.0-31.9, adult: Secondary | ICD-10-CM | POA: Diagnosis not present

## 2024-03-25 DIAGNOSIS — Z85828 Personal history of other malignant neoplasm of skin: Secondary | ICD-10-CM | POA: Diagnosis not present

## 2024-03-25 DIAGNOSIS — F17210 Nicotine dependence, cigarettes, uncomplicated: Secondary | ICD-10-CM | POA: Diagnosis not present

## 2024-04-07 DIAGNOSIS — L905 Scar conditions and fibrosis of skin: Secondary | ICD-10-CM | POA: Diagnosis not present

## 2024-04-07 DIAGNOSIS — D234 Other benign neoplasm of skin of scalp and neck: Secondary | ICD-10-CM | POA: Diagnosis not present

## 2024-04-07 DIAGNOSIS — Z85828 Personal history of other malignant neoplasm of skin: Secondary | ICD-10-CM | POA: Diagnosis not present

## 2024-04-07 DIAGNOSIS — D485 Neoplasm of uncertain behavior of skin: Secondary | ICD-10-CM | POA: Diagnosis not present

## 2024-04-07 NOTE — Progress Notes (Unsigned)
  HPI:      [name] is a 63 y.o. [id number] who presents today for her pessary consult and examination related to her pelvic floor weakening.  She has been having urinary incontinence since 2014. She wears depends to help with incontinence. She has leaking when cough and laughing, and can not make it to the bathroom to urinate in time. She has to position herself a certain way when she urinates to make sure her bladder is empty.   PMHx: She  has a past medical history of Anxiety, Anxiety and depression, Arthritis, Bipolar disorder (HCC), Borderline diabetes, Colon polyps, Depression, Female bladder prolapse, Hyperlipemia, Urinary incontinence, and Urinary tract infection. Also,  has a past surgical history that includes Cystocele repair (2015); Tubal ligation (1987); Abdominal hysterectomy (2014); Cholecystectomy (1985); Breast excisional biopsy (Right, 2012); and Appendectomy., family history includes Anxiety disorder in her brother and sister; Arthritis in her sister; COPD in her sister; Depression in her brother and mother; Diabetes in her mother; Heart attack in her father; Heart disease in her father; Hyperlipidemia in her mother; Hypertension in her mother; Ovarian cancer in her sister; Stroke in her father.,  reports that she has been smoking cigarettes. She has a 33 pack-year smoking history. She has never used smokeless tobacco. She reports that she does not drink alcohol and does not use drugs.  She has a current medication list which includes the following prescription(s): 5-hydroxytryptophan, vitamin c, b complex-biotin-fa, vitamin d , cholecalciferol, estradiol , magnesium citrate, metformin , nitrofurantoin  (macrocrystal-monohydrate), fish oil, super omega 3 epa/dha, and plant sterols and stanols. Also, has no known allergies.  ROS  Objective: There were no vitals taken for this visit. OBGyn Exam  Pessary Care Pessary removed and cleaned.  Vagina checked - without erosions -  pessary replaced.  A/P:  ***Pessary was cleaned and replaced today. Instructions given for care. Concerning symptoms to observe for are counseled to patient. Follow up scheduled for 3 months.  ***Pessary holiday advised.  Will leave out and follow symptoms closely.  Replace in 4 weeks.  Harlene Gander, CMA  [facility], [facility] 04/07/2024  10:26 AM

## 2024-04-08 ENCOUNTER — Encounter: Payer: Self-pay | Admitting: Internal Medicine

## 2024-04-08 DIAGNOSIS — J439 Emphysema, unspecified: Secondary | ICD-10-CM | POA: Insufficient documentation

## 2024-04-08 DIAGNOSIS — K760 Fatty (change of) liver, not elsewhere classified: Secondary | ICD-10-CM | POA: Insufficient documentation

## 2024-04-08 DIAGNOSIS — R911 Solitary pulmonary nodule: Secondary | ICD-10-CM | POA: Insufficient documentation

## 2024-04-09 ENCOUNTER — Ambulatory Visit (INDEPENDENT_AMBULATORY_CARE_PROVIDER_SITE_OTHER): Admitting: Obstetrics

## 2024-04-09 ENCOUNTER — Encounter: Payer: Self-pay | Admitting: Obstetrics

## 2024-04-09 VITALS — BP 124/68 | HR 75 | Ht 66.0 in | Wt 195.0 lb

## 2024-04-09 DIAGNOSIS — Z4689 Encounter for fitting and adjustment of other specified devices: Secondary | ICD-10-CM | POA: Diagnosis not present

## 2024-04-09 DIAGNOSIS — N8110 Cystocele, unspecified: Secondary | ICD-10-CM | POA: Diagnosis not present

## 2024-04-09 DIAGNOSIS — R32 Unspecified urinary incontinence: Secondary | ICD-10-CM | POA: Diagnosis not present

## 2024-04-22 ENCOUNTER — Encounter: Payer: Self-pay | Admitting: Dermatology

## 2024-04-22 ENCOUNTER — Ambulatory Visit (INDEPENDENT_AMBULATORY_CARE_PROVIDER_SITE_OTHER): Admitting: Dermatology

## 2024-04-22 VITALS — BP 125/86 | HR 84

## 2024-04-22 DIAGNOSIS — D229 Melanocytic nevi, unspecified: Secondary | ICD-10-CM

## 2024-04-22 DIAGNOSIS — L720 Epidermal cyst: Secondary | ICD-10-CM | POA: Diagnosis not present

## 2024-04-22 DIAGNOSIS — D485 Neoplasm of uncertain behavior of skin: Secondary | ICD-10-CM

## 2024-04-22 DIAGNOSIS — L578 Other skin changes due to chronic exposure to nonionizing radiation: Secondary | ICD-10-CM

## 2024-04-22 DIAGNOSIS — L821 Other seborrheic keratosis: Secondary | ICD-10-CM | POA: Diagnosis not present

## 2024-04-22 DIAGNOSIS — W908XXA Exposure to other nonionizing radiation, initial encounter: Secondary | ICD-10-CM

## 2024-04-22 DIAGNOSIS — D492 Neoplasm of unspecified behavior of bone, soft tissue, and skin: Secondary | ICD-10-CM

## 2024-04-22 DIAGNOSIS — D1801 Hemangioma of skin and subcutaneous tissue: Secondary | ICD-10-CM | POA: Diagnosis not present

## 2024-04-22 DIAGNOSIS — Z1283 Encounter for screening for malignant neoplasm of skin: Secondary | ICD-10-CM | POA: Diagnosis not present

## 2024-04-22 DIAGNOSIS — Z85828 Personal history of other malignant neoplasm of skin: Secondary | ICD-10-CM

## 2024-04-22 DIAGNOSIS — L814 Other melanin hyperpigmentation: Secondary | ICD-10-CM | POA: Diagnosis not present

## 2024-04-22 NOTE — Progress Notes (Signed)
 New Patient Visit   Subjective  Suzanne Chandler is a 63 y.o. female who presents for the following: Skin Cancer Screening and Full Body Skin Exam  Hx of BCC on left nasal dorsum  The patient presents for Total-Body Skin Exam (TBSE) for skin cancer screening and mole check. The patient has spots, moles and lesions to be evaluated, some may be new or changing.  The following portions of the chart were reviewed this encounter and updated as appropriate: medications, allergies, medical history  Review of Systems:  No other skin or systemic complaints except as noted in HPI or Assessment and Plan.  Objective  Well appearing patient in no apparent distress; mood and affect are within normal limits.  A full examination was performed including scalp, head, eyes, ears, nose, lips, neck, chest, axillae, abdomen, back, buttocks, bilateral upper extremities, bilateral lower extremities, hands, feet, fingers, toes, fingernails, and toenails. All findings within normal limits unless otherwise noted below.   Relevant physical exam findings are noted in the Assessment and Plan.  Right Nasal Sidewall 3 while papule with telangiectasias   Assessment & Plan   SKIN CANCER SCREENING PERFORMED TODAY.  ACTINIC DAMAGE - Chronic condition, secondary to cumulative UV/sun exposure - diffuse scaly erythematous macules with underlying dyspigmentation - Recommend daily broad spectrum sunscreen SPF 30+ to sun-exposed areas, reapply every 2 hours as needed.  - Staying in the shade or wearing long sleeves, sun glasses (UVA+UVB protection) and wide brim hats (4-inch brim around the entire circumference of the hat) are also recommended for sun protection.  - Call for new or changing lesions.  LENTIGINES, SEBORRHEIC KERATOSES, HEMANGIOMAS - Benign normal skin lesions - Benign-appearing - Call for any changes  MELANOCYTIC NEVI - Tan-brown and/or pink-flesh-colored symmetric macules and papules - Benign  appearing on exam today - Observation - Call clinic for new or changing moles - Recommend daily use of broad spectrum spf 30+ sunscreen to sun-exposed areas.   HISTORY OF BASAL CELL CARCINOMA OF THE SKIN - No evidence of recurrence today - Recommend regular full body skin exams - Recommend daily broad spectrum sunscreen SPF 30+ to sun-exposed areas, reapply every 2 hours as needed.  - Call if any new or changing lesions are noted between office visits   NEOPLASM OF UNCERTAIN BEHAVIOR OF SKIN Right Nasal Sidewall Skin / nail biopsy Type of biopsy: tangential   Informed consent: discussed and consent obtained   Timeout: patient name, date of birth, surgical site, and procedure verified   Procedure prep:  Patient was prepped and draped in usual sterile fashion Prep type:  Isopropyl alcohol Anesthesia: the lesion was anesthetized in a standard fashion   Anesthetic:  1% lidocaine w/ epinephrine 1-100,000 buffered w/ 8.4% NaHCO3 Instrument used: DermaBlade   Hemostasis achieved with: aluminum chloride   Outcome: patient tolerated procedure well   Post-procedure details: sterile dressing applied and wound care instructions given   Dressing type: petrolatum gauze and bandage    Specimen 1 - Surgical pathology Differential Diagnosis: r/o milia vs bcc  Check Margins: No ACTINIC SKIN DAMAGE   LENTIGINES   SEBORRHEIC KERATOSIS   CHERRY ANGIOMA   MULTIPLE BENIGN NEVI   HISTORY OF BASAL CELL CARCINOMA    Return in about 6 months (around 10/23/2024) for TBSC.  I, Berwyn Lesches, Surg Tech III, am acting as scribe for RUFUS CHRISTELLA HOLY, MD.   We spent 45 min reviewing records, taking the patient history, providing face to face care with the patient, sending  prescriptions.   Documentation: I have reviewed the above documentation for accuracy and completeness, and I agree with the above.  RUFUS CHRISTELLA HOLY, MD

## 2024-04-22 NOTE — Patient Instructions (Addendum)

## 2024-04-24 LAB — SURGICAL PATHOLOGY

## 2024-04-25 ENCOUNTER — Ambulatory Visit: Payer: Self-pay | Admitting: Dermatology

## 2024-06-11 ENCOUNTER — Telehealth: Payer: Self-pay

## 2024-06-11 NOTE — Telephone Encounter (Signed)
 Copied from CRM #[id number]. Topic: Appointments - Transfer of Care >> Jun 11, 2024  1:51 PM Berneda FALCON wrote: Pt is requesting to transfer FROM: Suzanne Chandler Pt is requesting to transfer TO: Suzanne Chandler, Suzanne Chandler Reason for requested transfer: Location, she would like to be at a closer location It is the responsibility of the team the patient would like to transfer to (Dr. Abbey) to reach out to the patient if for any reason this transfer is not acceptable.

## 2024-06-23 ENCOUNTER — Ambulatory Visit

## 2024-06-23 VITALS — BP 92/60 | HR 79 | Temp 98.5°F | Ht 66.0 in | Wt 200.0 lb

## 2024-06-23 DIAGNOSIS — E559 Vitamin D deficiency, unspecified: Secondary | ICD-10-CM

## 2024-06-23 DIAGNOSIS — F411 Generalized anxiety disorder: Secondary | ICD-10-CM | POA: Diagnosis not present

## 2024-06-23 DIAGNOSIS — E119 Type 2 diabetes mellitus without complications: Secondary | ICD-10-CM

## 2024-06-23 DIAGNOSIS — E785 Hyperlipidemia, unspecified: Secondary | ICD-10-CM | POA: Diagnosis not present

## 2024-06-23 DIAGNOSIS — R32 Unspecified urinary incontinence: Secondary | ICD-10-CM | POA: Insufficient documentation

## 2024-06-23 DIAGNOSIS — Z720 Tobacco use: Secondary | ICD-10-CM

## 2024-06-23 NOTE — Assessment & Plan Note (Signed)
 UDT on low dose lung cancer screening (02/2024).  Long-term use with variable success in quitting. Stress contributes to smoking. Encourage nicotine  replacement therapy (gum and patches). Discuss benefits of smoking cessation on overall health.

## 2024-06-23 NOTE — Assessment & Plan Note (Signed)
-   Previous low levels, takes OTC vitamin D  supplement but inconsistently   - Order vitamin D  level to assess status. - Prescribe vitamin D3 once weekly if low.

## 2024-06-23 NOTE — Patient Instructions (Addendum)
-   I recommend increasing daily water intake to about 40-50 oz daily.  - I will update your cholesterol, A1c, vitamin d , chemistry today. If your cholesterol is worse than what it has been I recommend we start you on small dose of statin.  - Please reach out to your urologist to follow up on urinary concerns.  - Diet: Emphasize whole grains, lean proteins, fruits, and vegetables. Limit processed foods and sugary drinks. Exercise: Aim for 150 minutes of moderate aerobic activity weekly plus strength training twice a week. Weight Loss: Target 5% - You can try calling [facility] to see if you can schedule an appointment.

## 2024-06-23 NOTE — Progress Notes (Addendum)
 New Patient Office Visit   Subjective   Patient ID: Suzanne Chandler, female    DOB: 1961/08/08  Age: 63 y.o. MRN: [id number]  CC:  Chief Complaint  Patient presents with   Establish Care    HPI Suzanne Chandler presents to establish care. She  has a past medical history of Anxiety (Lifetime; tremendous stressful life events.), Anxiety and depression, Arthritis (Orthopedic Dr. advised may have slight arthritis.), Bipolar disorder (HCC), Borderline diabetes, Colon polyps, Depression (Lifetime; not deep ; treated for.), Female bladder prolapse, Hyperlipemia, Urinary incontinence, and Urinary tract infection.  HPI Discussed the use of AI scribe software for clinical note transcription with the patient, who gave verbal consent to proceed.  History of Present Illness Suzanne Chandler is a 63 year old female who presents for a new patient visit.   She has a long history of smoking for 30 plus years, currently attempting to quit using nicotine  gum intermittently. Her smoking varies daily, sometimes reaching a pack a day, influenced by stress levels. She has nicotine  patches at home but has not used them yet.  She experiences frequent urination and recurrent urinary tract infections, attributed to a bladder sling that prevents complete bladder emptying. She has had two UTIs recently and is awaiting a pessary ordered over three months ago.  Her A1c has increased to 6.5%. She takes metformin  500 mg once daily but finds it nauseating and sometimes substitutes it with berberine. She is concerned about her cholesterol levels and manages them with plant sterols, stanols, and dietary changes.  She reports significant weight gain over the past three years, attributed to emotional eating and stress related to family issues. Her current weight is around 200 pounds, which she finds distressing. She attempts to manage her weight by skipping meals and fasting intermittently.  She takes  various vitamins and supplements, including vitamin C, vitamin D , omega-3, and magnesium, alternating between different dosages and types. She is concerned about her vitamin D  levels, as they were previously low.  She has a history of skin issues, including lesion removed from her face. She is vigilant about skin changes and has had other lesions evaluated in the past.  She describes significant stress and emotional distress related to family dynamics, particularly with her mother and siblings, feeling isolated and unsupported, impacting her mental health and overall well-being.   Outpatient Encounter Medications as of 06/23/2024  Medication Sig   Ascorbic Acid (VITAMIN C) 1000 MG tablet Take 1,000 mg by mouth daily.   Ashwagandha 500 MG CAPS Take 600 mg by mouth.   Cholecalciferol (VITAMIN D  PO) Take 5,000 Units by mouth daily.   Coenzyme Q10 (CO Q-10) 400 MG CAPS    estradiol  (ESTRACE  VAGINAL) 0.1 MG/GM vaginal cream Place 1 Applicatorful vaginally at bedtime as needed (vaginal dryness).   GAMMA AMINOBUTYRIC ACID PO Take 750 mg by mouth daily.   magnesium gluconate (MAGONATE) 500 (27 Mg) MG TABS tablet Take 400 mg by mouth daily.   Menaquinone-7 (K2 PO) Take 100 mcg by mouth daily.   Omega 3 1000 MG CAPS    Plant Sterols and Stanols (CHOLEST OFF PO) Take 2 capsules by mouth daily.    zinc gluconate 50 MG tablet Take 30 mg by mouth daily.   [DISCONTINUED] 5-Hydroxytryptophan (5-HTP PO) Take by mouth.   [DISCONTINUED] B Complex-Biotin-FA (B COMPLEX 100 TR PO)    [DISCONTINUED] cholecalciferol (VITAMIN D3) 25 MCG (1000 UNIT) tablet Take 1,000 Units by mouth daily.   [DISCONTINUED] magnesium  citrate solution Take 1 tablet by mouth.   [DISCONTINUED] metFORMIN  (GLUCOPHAGE -XR) 500 MG 24 hr tablet Take 1 tablet (500 mg total) by mouth daily with breakfast.   [DISCONTINUED] nitrofurantoin , macrocrystal-monohydrate, (MACROBID ) 100 MG capsule Take 1 capsule (100 mg total) by mouth 2 (two) times daily.  (Patient not taking: Reported on 04/09/2024)   [DISCONTINUED] Omega-3 Fatty Acids (FISH OIL) 1000 MG CAPS Take 1,000 mg by mouth 3 (three) times daily.   [DISCONTINUED] Omega-3 Fatty Acids (SUPER OMEGA 3 EPA/DHA) 1000 MG CAPS Take 3 g by mouth.   No facility-administered encounter medications on file as of 06/23/2024.    Past Surgical History:  Procedure Laterality Date   ABDOMINAL HYSTERECTOMY  2014   APPENDECTOMY     BREAST EXCISIONAL BIOPSY Right 2012   neg   BREAST SURGERY  Benign lump removal right breast behind areola.   CHOLECYSTECTOMY  1985   COLON SURGERY  03/2017; Polyps removal.   2x 2 types Polyps removed   CYSTOCELE REPAIR  2015   TUBAL LIGATION  1987    ROS As per HPI    Objective    BP 92/60 (BP Location: Right Arm, Patient Position: Sitting, Cuff Size: Normal)   Pulse 79   Temp 98.5 F (36.9 C) (Oral)   Ht 5' 6 (1.676 m)   Wt 200 lb (90.7 kg)   SpO2 92%   BMI 32.28 kg/m     06/23/2024    2:57 PM 02/26/2024    3:31 PM 04/24/2017   10:13 AM  Depression screen PHQ 2/9  Decreased Interest 0 0 0  Down, Depressed, Hopeless 0 1 2  PHQ - 2 Score 0 1 2  Altered sleeping 1 2 1   Tired, decreased energy 1 0 2  Change in appetite 1 3 0  Feeling bad or failure about yourself  0 1 2  Trouble concentrating 0 0 1  Moving slowly or fidgety/restless 0 1 1  Suicidal thoughts 0 0 0  PHQ-9 Score 3 8 9   Difficult doing work/chores Somewhat difficult Somewhat difficult       06/23/2024    2:57 PM 02/26/2024    3:32 PM  GAD 7 : Generalized Anxiety Score  Nervous, Anxious, on Edge 3 3  Control/stop worrying 3 3  Worry too much - different things 3 3  Trouble relaxing 3 3  Restless 0 1  Easily annoyed or irritable 3 3  Afraid - awful might happen 2 1  Total GAD 7 Score 17 17  Anxiety Difficulty Somewhat difficult Somewhat difficult    SDOH Screenings   Food Insecurity: No Food Insecurity (06/20/2024)  Housing: Unknown (06/20/2024)  Transportation Needs: No  Transportation Needs (06/20/2024)  Depression (PHQ2-9): Low Risk  (06/23/2024)  Financial Resource Strain: Low Risk  (06/20/2024)  Physical Activity: Insufficiently Active (06/20/2024)  Social Connections: Socially Isolated (06/20/2024)  Stress: Stress Concern Present (06/20/2024)  Tobacco Use: High Risk (06/23/2024)     Physical Exam Constitutional:      Appearance: Normal appearance.  HENT:     Head: Normocephalic and atraumatic.     Right Ear: Tympanic membrane normal.     Left Ear: Tympanic membrane normal.     Mouth/Throat:     Mouth: Mucous membranes are moist.  Neck:     Thyroid : No thyroid  mass or thyroid  tenderness.  Cardiovascular:     Rate and Rhythm: Normal rate and regular rhythm.  Pulmonary:     Effort: Pulmonary effort is normal.  Breath sounds: Normal breath sounds.  Abdominal:     General: Bowel sounds are normal.     Palpations: Abdomen is soft.  Musculoskeletal:     Cervical back: Neck supple. No rigidity.     Right lower leg: No edema.     Left lower leg: No edema.  Skin:    General: Skin is warm.  Neurological:     Mental Status: She is alert and oriented to person, place, and time.  Psychiatric:        Mood and Affect: Mood normal.        Behavior: Behavior normal.        Lab Results  Component Value Date   TSH 1.41 01/16/2017   No results found for: WBC, HGB, HCT, MCV, PLT Lab Results  Component Value Date   NA 139 03/05/2024   K 4.1 03/05/2024   CO2 28 03/05/2024   GLUCOSE 117 (H) 03/05/2024   BUN 12 03/05/2024   CREATININE 0.75 03/05/2024   BILITOT 0.4 01/23/2018   ALKPHOS 65 01/23/2018   AST 11 01/23/2018   ALT 11 01/23/2018   PROT 7.0 01/23/2018   ALBUMIN 4.3 01/23/2018   CALCIUM  9.9 03/05/2024   GFR 85.15 03/05/2024   Lab Results  Component Value Date   CHOL 171 03/05/2024   CHOL 191 01/23/2018   CHOL 193 12/19/2016   Lab Results  Component Value Date   HDL 27.20 (L) 03/05/2024   HDL 33.40 (L) 01/23/2018    HDL 29.20 (L) 12/19/2016   Lab Results  Component Value Date   LDLCALC 85 03/05/2024   LDLCALC 131 (H) 12/19/2016   Lab Results  Component Value Date   TRIG 295.0 (H) 03/05/2024   TRIG 236.0 (H) 01/23/2018   TRIG 164.0 (H) 12/19/2016   Lab Results  Component Value Date   CHOLHDL 6 03/05/2024   CHOLHDL 6 01/23/2018   CHOLHDL 7 12/19/2016   Lab Results  Component Value Date   HGBA1C 6.5 03/05/2024   HGBA1C 5.9 01/23/2018   HGBA1C 5.9 07/17/2017   Assessment & Plan:  Type 2 diabetes mellitus without complication, without long-term current use of insulin (HCC) Assessment & Plan: - A1c at 6.5% in May 2025. Repeat A1c today.  - Continue metformin  500 mg once daily with food. - Provide dietary guidance on low carbohydrate and high fiber intake. - Encourage 30 minutes of moderate exercise daily.  Orders: -     Hemoglobin A1c -     Comprehensive metabolic panel with GFR  Tobacco abuse Assessment & Plan: UDT on low dose lung cancer screening (02/2024).  Long-term use with variable success in quitting. Stress contributes to smoking. Encourage nicotine  replacement therapy (gum and patches). Discuss benefits of smoking cessation on overall health.    Hyperlipidemia, unspecified hyperlipidemia type Assessment & Plan: Elevated LDL cholesterol with dietary challenges. Reluctant to use statins. Considering calcium  score test for cardiovascular risk assessment if LDL elevated and ASCVD risk >10% - Order lipid panel to assess cholesterol levels. - Provide dietary guidance on reducing saturated fats and increasing fiber. - Discuss potential calcium  score test if cholesterol remains elevated. Check CMP The 10-year ASCVD risk score (Arnett DK, et al., 2019) is: 11.4%   Values used to calculate the score:     Age: 51 years     Clincally relevant sex: Female     Is Non-Hispanic African American: No     Diabetic: Yes     Tobacco smoker: Yes  Systolic Blood Pressure: 92 mmHg      Is BP treated: No     HDL Cholesterol: 27.2 mg/dL     Total Cholesterol: 171 mg/dL   Orders: -     Lipid panel  Vitamin D  deficiency Assessment & Plan: - Previous low levels, takes OTC vitamin D  supplement but inconsistently   - Order vitamin D  level to assess status. - Prescribe vitamin D3 once weekly if low.  Orders: -     VITAMIN D  25 Hydroxy (Vit-D Deficiency, Fractures)  Generalized anxiety disorder Assessment & Plan: Mostly related to her family dynamics. Previously treated with Clonazepam but patient is aware of s/e and is not interested in pharmacotherapy. She is taking Gamma-aminobutyric supplement. No SI/HI, continue to f/u and discuss pharmacotherapy and CBT In the future.      Return in about 3 months (around 09/22/2024) for Chronic follow up .   Luke Shade, MD  The 10-year ASCVD risk score (Arnett DK, et al., 2019) is: 11.4%   Values used to calculate the score:     Age: 83 years     Clincally relevant sex: Female     Is Non-Hispanic African American: No     Diabetic: Yes     Tobacco smoker: Yes     Systolic Blood Pressure: 92 mmHg     Is BP treated: No     HDL Cholesterol: 27.2 mg/dL     Total Cholesterol: 171 mg/dL

## 2024-06-23 NOTE — Assessment & Plan Note (Signed)
-   A1c at 6.5% in May 2025. Repeat A1c today.  - Continue metformin  500 mg once daily with food. - Provide dietary guidance on low carbohydrate and high fiber intake. - Encourage 30 minutes of moderate exercise daily.

## 2024-06-23 NOTE — Assessment & Plan Note (Signed)
 Mostly related to her family dynamics. Previously treated with Clonazepam but patient is aware of s/e and is not interested in pharmacotherapy. She is taking Gamma-aminobutyric supplement. No SI/HI, continue to f/u and discuss pharmacotherapy and CBT In the future.

## 2024-06-23 NOTE — Assessment & Plan Note (Signed)
 Following with Urology and gynecologist. Has appointment with urology later this week. Encouraged patient to reach out to gynecologist to f/u on status of pessary. On Estrace  cream for vaginal dryness by previous PCP.

## 2024-06-23 NOTE — Assessment & Plan Note (Addendum)
 Elevated LDL cholesterol with dietary challenges. Reluctant to use statins. Considering calcium  score test for cardiovascular risk assessment if LDL elevated and ASCVD risk >10% - Order lipid panel to assess cholesterol levels. - Provide dietary guidance on reducing saturated fats and increasing fiber. - Discuss potential calcium  score test if cholesterol remains elevated. Check CMP The 10-year ASCVD risk score (Arnett DK, et al., 2019) is: 11.4%   Values used to calculate the score:     Age: 63 years     Clincally relevant sex: Female     Is Non-Hispanic African American: No     Diabetic: Yes     Tobacco smoker: Yes     Systolic Blood Pressure: 92 mmHg     Is BP treated: No     HDL Cholesterol: 27.2 mg/dL     Total Cholesterol: 171 mg/dL

## 2024-06-24 ENCOUNTER — Ambulatory Visit: Payer: Self-pay

## 2024-06-24 DIAGNOSIS — E1169 Type 2 diabetes mellitus with other specified complication: Secondary | ICD-10-CM

## 2024-06-24 DIAGNOSIS — E118 Type 2 diabetes mellitus with unspecified complications: Secondary | ICD-10-CM

## 2024-06-24 LAB — LIPID PANEL
Cholesterol: 187 mg/dL (ref 0–200)
HDL: 22.2 mg/dL — ABNORMAL LOW (ref >39.00)
NonHDL: 164.73
Total CHOL/HDL Ratio: 8
Triglycerides: 444 mg/dL — ABNORMAL HIGH (ref 0.0–149.0)
VLDL: 88.8 mg/dL — ABNORMAL HIGH (ref 0.0–40.0)

## 2024-06-24 LAB — COMPREHENSIVE METABOLIC PANEL WITH GFR
ALT: 34 U/L (ref 0–35)
AST: 28 U/L (ref 0–37)
Albumin: 4.4 g/dL (ref 3.5–5.2)
Alkaline Phosphatase: 84 U/L (ref 39–117)
BUN: 13 mg/dL (ref 6–23)
CO2: 24 meq/L (ref 19–32)
Calcium: 9.8 mg/dL (ref 8.4–10.5)
Chloride: 106 meq/L (ref 96–112)
Creatinine, Ser: 0.81 mg/dL (ref 0.40–1.20)
GFR: 77.48 mL/min (ref >60.00)
Glucose, Bld: 90 mg/dL (ref 70–99)
Potassium: 4.3 meq/L (ref 3.5–5.1)
Sodium: 142 meq/L (ref 135–145)
Total Bilirubin: 0.6 mg/dL (ref 0.2–1.2)
Total Protein: 7.1 g/dL (ref 6.0–8.3)

## 2024-06-24 LAB — LDL CHOLESTEROL, DIRECT: Direct LDL: 103 mg/dL

## 2024-06-24 LAB — VITAMIN D 25 HYDROXY (VIT D DEFICIENCY, FRACTURES): VITD: 32.04 ng/mL (ref 30.00–100.00)

## 2024-06-24 LAB — HEMOGLOBIN A1C: Hgb A1c MFr Bld: 7 % — ABNORMAL HIGH (ref 4.6–6.5)

## 2024-06-24 MED ORDER — METFORMIN HCL 500 MG PO TABS: 500.0000 mg | ORAL_TABLET | Freq: Two times a day (BID) | ORAL | 3 refills | Status: DC

## 2024-06-24 MED ORDER — ROSUVASTATIN CALCIUM 5 MG PO TABS: 5.0000 mg | ORAL_TABLET | Freq: Every day | ORAL | 3 refills | Status: DC

## 2024-06-26 NOTE — Progress Notes (Deleted)
  HPI:      [name] is a 63 y.o. [phone] who presents today for her pessary insertion and examination related to her pelvic floor weakening.  She recently fitted for a Size 3 ring support pessary.  PMHx: She  has a past medical history of Anxiety (Lifetime; tremendous stressful life events.), Anxiety and depression, Arthritis (Orthopedic Dr. advised may have slight arthritis.), Bipolar disorder (HCC), Borderline diabetes, Colon polyps, Depression (Lifetime; not deep ; treated for.), Female bladder prolapse, Hyperlipemia, Urinary incontinence, and Urinary tract infection. Also,  has a past surgical history that includes Cystocele repair (2015); Tubal ligation (1987); Abdominal hysterectomy (2014); Cholecystectomy (1985); Breast excisional biopsy (Right, 2012); Appendectomy; Colon surgery (03/2017; Polyps removal.); and Breast surgery (Benign lump removal right breast behind areola.)., family history includes Alcohol abuse in her son and son; Anxiety disorder in her brother, sister, son, and son; Arthritis in her sister; COPD in her sister; Cancer in her paternal aunt, paternal aunt, paternal uncle, paternal uncle, and sister; Depression in her brother, daughter, daughter, maternal uncle, mother, sister, son, and son; Diabetes in her maternal aunt, maternal aunt, maternal aunt, maternal aunt, maternal grandmother, maternal uncle, maternal uncle, and mother; Early death in her father; Heart attack in her father; Heart disease in her father, maternal grandmother, paternal aunt, paternal aunt, paternal uncle, paternal uncle, paternal uncle, and paternal uncle; Hyperlipidemia in her father, mother, son, and son; Hypertension in her maternal aunt, maternal aunt, maternal grandmother, mother, son, and son; Kidney disease in her maternal aunt, maternal aunt, and mother; Obesity in her daughter and mother; Ovarian cancer in her sister; Stroke in her father, maternal grandfather, maternal uncle, and  maternal uncle; Varicose Veins in her maternal grandmother and mother; Vision loss in her daughter, daughter, maternal grandmother, and mother.,  reports that she has been smoking cigarettes. She has a 33 pack-year smoking history. She has never used smokeless tobacco. She reports that she does not drink alcohol and does not use drugs.  She has a current medication list which includes the following prescription(s): vitamin c, ashwagandha, vitamin d , co q-10, estradiol , gamma-aminobutyric acid, magnesium gluconate, menaquinone-7, metformin , omega 3, plant sterols and stanols, rosuvastatin , and zinc gluconate. Also, has no known allergies.  ROS  Objective: There were no vitals taken for this visit. OBGyn Exam  Pessary Care Pessary removed and cleaned.  Vagina checked - without erosions - pessary replaced.  A/P:  Size 3 Ring Pessary with support was inserted today. Instructions given for care. Concerning symptoms to observe for are counseled to patient. Follow up scheduled for 3 months.   Estil Mangle, DO [facility]

## 2024-06-27 ENCOUNTER — Ambulatory Visit: Admitting: Obstetrics and Gynecology

## 2024-06-27 ENCOUNTER — Ambulatory Visit: Admitting: Obstetrics

## 2024-07-07 ENCOUNTER — Ambulatory Visit: Admitting: Internal Medicine

## 2024-07-10 ENCOUNTER — Ambulatory Visit: Admitting: Dermatology

## 2024-08-18 NOTE — Progress Notes (Unsigned)
    GYNECOLOGY PROGRESS NOTE  Subjective:  PCP: Bair, Kalpana, MD  Patient ID: Suzanne Chandler, female    DOB: Feb 05, 1961, 63 y.o.   MRN: [id number]  HPI  Patient is a 63 y.o. G101P3003 female who presents for pessary insertion. She was seen July 2025 for fitting of a #3 ring with support pessary.  She has been having urinary incontinence since 2014. S/p partial abdominal hysterectomy in 2014 and developed incontinence, then had a bladder sling in 2015, which she feels has stretched out and no longer providing relief.  She has leaking with coughing, laughing, lifting, and also urge incontinence, where she won't make it to the bathroom on time. Sometimes has incomplete emptying. Wears depends. Has been using vaginal Estrace  which she feels is helping a little.  Had planned on returning the urogynecology in September for another surgery.   Last pap: 03/10/24, normal; denies hx of abnormals Last mammogram: 03/18/24, normal STIHx: denies PMB: none, s/p hysterectomy   Review of Systems {ros; complete:30496}   Objective:   There were no vitals taken for this visit. There is no height or weight on file to calculate BMI.  General appearance: {general exam:16600} Abdomen: {abdominal exam:16834} Pelvic: {pelvic exam:16852::cervix normal in appearance,external genitalia normal,no adnexal masses or tenderness,no cervical motion tenderness,rectovaginal septum normal,uterus normal size, shape, and consistency,vagina normal without discharge} Extremities: {extremity exam:5109} Neurologic: {neuro exam:17854}   Assessment/Plan:   No diagnosis found.   There are no diagnoses linked to this encounter.     Estil Mangle, DO [facility]

## 2024-08-21 ENCOUNTER — Ambulatory Visit: Admitting: Obstetrics

## 2024-08-21 ENCOUNTER — Ambulatory Visit (INDEPENDENT_AMBULATORY_CARE_PROVIDER_SITE_OTHER): Admitting: Obstetrics

## 2024-08-21 ENCOUNTER — Encounter: Payer: Self-pay | Admitting: Obstetrics

## 2024-08-21 VITALS — BP 134/83 | HR 81 | Wt 198.0 lb

## 2024-08-21 DIAGNOSIS — N8110 Cystocele, unspecified: Secondary | ICD-10-CM | POA: Diagnosis not present

## 2024-08-21 DIAGNOSIS — Z4689 Encounter for fitting and adjustment of other specified devices: Secondary | ICD-10-CM | POA: Diagnosis not present

## 2024-08-21 DIAGNOSIS — R32 Unspecified urinary incontinence: Secondary | ICD-10-CM | POA: Diagnosis not present

## 2024-08-21 DIAGNOSIS — K5900 Constipation, unspecified: Secondary | ICD-10-CM

## 2024-08-21 NOTE — Progress Notes (Signed)
    GYNECOLOGY PROGRESS NOTE  Subjective:  PCP: Bair, Kalpana, MD  Patient ID: Suzanne Chandler, female    DOB: December 19, 1960, 63 y.o.   MRN: [id number]  HPI  Patient is a 63 y.o. G31P3003 female who presents for pessary that was fitted earlier today, size #3 ring with support, without problems, but is now having a pinching feeling when she walks. Prior fitting also consistent with Size #3.   The following portions of the patient's history were reviewed and updated as appropriate: allergies, current medications, past family history, past medical history, past social history, past surgical history, and problem list.  Review of Systems Pertinent items are noted in HPI.   Objective:   There were no vitals taken for this visit. There is no height or weight on file to calculate BMI.  General appearance: alert, cooperative, no distress, and mildly obese Abdomen: soft, non-tender; bowel sounds normal; no masses,  no organomegaly Pelvic: cervix normal in appearance, external genitalia normal, no adnexal masses or tenderness, no cervical motion tenderness, rectovaginal septum normal, uterus normal size, shape, and consistency, and vagina normal without discharge; large stool burden felt in posterior vagina running the entire length.  Extremities: extremities normal, atraumatic, no cyanosis or edema Neurologic: Grossly normal   Assessment/Plan:   1. Constipation, unspecified constipation type    Feel pt's constipation creating the discomfort with pessary and preventing from sitting correctly inside vault. Removed pessary today and provided back to pt, encouraged her to initiate a bowel regimen to clear out her colorectal region and to come to clinic next week to have pessary reinserted.    Estil Mangle, DO [facility]

## 2024-09-15 ENCOUNTER — Encounter: Payer: Self-pay | Admitting: Obstetrics and Gynecology

## 2024-09-15 ENCOUNTER — Other Ambulatory Visit (HOSPITAL_COMMUNITY)
Admission: RE | Admit: 2024-09-15 | Discharge: 2024-09-15 | Disposition: A | Source: Ambulatory Visit | Attending: Obstetrics and Gynecology | Admitting: Obstetrics and Gynecology

## 2024-09-15 ENCOUNTER — Ambulatory Visit: Admitting: Obstetrics and Gynecology

## 2024-09-15 VITALS — BP 129/74 | HR 85 | Ht 65.0 in | Wt 196.2 lb

## 2024-09-15 DIAGNOSIS — R319 Hematuria, unspecified: Secondary | ICD-10-CM | POA: Diagnosis not present

## 2024-09-15 DIAGNOSIS — N3946 Mixed incontinence: Secondary | ICD-10-CM

## 2024-09-15 DIAGNOSIS — N816 Rectocele: Secondary | ICD-10-CM

## 2024-09-15 DIAGNOSIS — R350 Frequency of micturition: Secondary | ICD-10-CM

## 2024-09-15 DIAGNOSIS — N8110 Cystocele, unspecified: Secondary | ICD-10-CM

## 2024-09-15 DIAGNOSIS — L739 Follicular disorder, unspecified: Secondary | ICD-10-CM

## 2024-09-15 LAB — POCT URINALYSIS DIP (CLINITEK)
Bilirubin, UA: NEGATIVE
Glucose, UA: NEGATIVE mg/dL
Ketones, POC UA: NEGATIVE mg/dL
Leukocytes, UA: NEGATIVE
Nitrite, UA: NEGATIVE
POC PROTEIN,UA: NEGATIVE
Spec Grav, UA: 1.02 (ref 1.010–1.025)
Urobilinogen, UA: 0.2 U/dL
pH, UA: 6.5 (ref 5.0–8.0)

## 2024-09-15 LAB — URINALYSIS, COMPLETE (UACMP) WITH MICROSCOPIC
Bilirubin Urine: NEGATIVE
Glucose, UA: NEGATIVE mg/dL
Ketones, ur: NEGATIVE mg/dL
Leukocytes,Ua: NEGATIVE
Nitrite: NEGATIVE
Protein, ur: NEGATIVE mg/dL
Specific Gravity, Urine: 1.017 (ref 1.005–1.030)
pH: 6 (ref 5.0–8.0)

## 2024-09-15 MED ORDER — DOXYCYCLINE HYCLATE 100 MG PO CAPS: 100.0000 mg | ORAL_CAPSULE | Freq: Two times a day (BID) | ORAL | 0 refills | Status: AC
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
## 2024-09-15 NOTE — Patient Instructions (Addendum)
 Today we talked about ways to manage bladder urgency such as altering your diet to avoid irritative beverages and foods (bladder diet) as well as attempting to decrease stress and other exacerbating factors.  You can also chew a plain Tums 1-3 times per day to make your urine less acidic, especially if you have eating/drinking acidic things.   The Most Bothersome Foods* The Least Bothersome Foods*  Coffee - Regular & Decaf Tea - caffeinated Carbonated beverages - cola, non-colas, diet & caffeine-free Alcohols - Beer, Red Wine, White Wine, [address] - Grapefruit, [place], Orange, [employer] - Cranberry, Grapefruit, Orange, Pineapple Vegetables - Tomato & Tomato Products Flavor Enhancers - Hot peppers, Spicy foods, Chili, Horseradish, Vinegar, Monosodium glutamate (MSG) Artificial Sweeteners - NutraSweet, Sweet 'N Low, Equal (sweetener), Saccharin Ethnic foods - Mexican, Thai, Indian food [employer] - low-fat & whole Fruits - Bananas, Blueberries, Honeydew melon, Pears, Raisins, Watermelon Vegetables - Broccoli, [address] Sprouts, [place], Carrots, Cauliflower, [place], Cucumber, Mushrooms, Peas, Radishes, Squash, Zucchini, White potatoes, Sweet potatoes & yams Poultry - Chicken, Eggs, Turkey, [employer] - Beef, Dipl[employer] Operational [employer], Lamb Seafood - Shrimp, [place] fish, Salmon Grains - Oat, Rice Snacks - Pretzels, Popcorn  *Mitch ALF et al. Diet and its role in interstitial cystitis/bladder pain syndrome (IC/BPS) and comorbid conditions. [employer]. BJU Int. 2012 Jan 11.    Start the doxycycline  for the folliculitis  Try to not use a lot of products in the vagina, especially wipes that have alcohol or paraben's in them

## 2024-09-16 LAB — URINE CULTURE: Culture: 40000 — AB

## 2024-09-25 ENCOUNTER — Ambulatory Visit

## 2024-09-25 ENCOUNTER — Ambulatory Visit: Payer: Self-pay

## 2024-09-25 VITALS — BP 118/80 | HR 99 | Temp 98.2°F | Ht 66.0 in | Wt 198.8 lb

## 2024-09-25 DIAGNOSIS — Z7984 Long term (current) use of oral hypoglycemic drugs: Secondary | ICD-10-CM

## 2024-09-25 DIAGNOSIS — Z636 Dependent relative needing care at home: Secondary | ICD-10-CM | POA: Diagnosis not present

## 2024-09-25 DIAGNOSIS — E782 Mixed hyperlipidemia: Secondary | ICD-10-CM

## 2024-09-25 DIAGNOSIS — L0231 Cutaneous abscess of buttock: Secondary | ICD-10-CM

## 2024-09-25 DIAGNOSIS — F332 Major depressive disorder, recurrent severe without psychotic features: Secondary | ICD-10-CM | POA: Diagnosis not present

## 2024-09-25 DIAGNOSIS — E118 Type 2 diabetes mellitus with unspecified complications: Secondary | ICD-10-CM

## 2024-09-25 DIAGNOSIS — M25571 Pain in right ankle and joints of right foot: Secondary | ICD-10-CM | POA: Diagnosis not present

## 2024-09-25 DIAGNOSIS — G8929 Other chronic pain: Secondary | ICD-10-CM | POA: Diagnosis not present

## 2024-09-25 DIAGNOSIS — B351 Tinea unguium: Secondary | ICD-10-CM

## 2024-09-25 DIAGNOSIS — E1169 Type 2 diabetes mellitus with other specified complication: Secondary | ICD-10-CM

## 2024-09-25 DIAGNOSIS — F411 Generalized anxiety disorder: Secondary | ICD-10-CM

## 2024-09-25 LAB — COMPREHENSIVE METABOLIC PANEL WITH GFR
ALT: 32 U/L (ref 3–35)
AST: 30 U/L (ref 5–37)
Albumin: 4.2 g/dL (ref 3.5–5.2)
Alkaline Phosphatase: 89 U/L (ref 39–117)
BUN: 12 mg/dL (ref 6–23)
CO2: 24 meq/L (ref 19–32)
Calcium: 9.4 mg/dL (ref 8.4–10.5)
Chloride: 104 meq/L (ref 96–112)
Creatinine, Ser: 0.7 mg/dL (ref 0.40–1.20)
GFR: 92.14 mL/min (ref >60.00)
Glucose, Bld: 112 mg/dL — ABNORMAL HIGH (ref 70–99)
Potassium: 4.3 meq/L (ref 3.5–5.1)
Sodium: 138 meq/L (ref 135–145)
Total Bilirubin: 0.7 mg/dL (ref 0.2–1.2)
Total Protein: 6.9 g/dL (ref 6.0–8.3)

## 2024-09-25 LAB — HEMOGLOBIN A1C: Hgb A1c MFr Bld: 6.5 % (ref 4.6–6.5)

## 2024-09-25 LAB — LIPID PANEL
Cholesterol: 168 mg/dL (ref 28–200)
HDL: 23.7 mg/dL — ABNORMAL LOW (ref >39.00)
LDL Cholesterol: 107 mg/dL — ABNORMAL HIGH (ref 10–99)
NonHDL: 144.24
Total CHOL/HDL Ratio: 7
Triglycerides: 185 mg/dL — ABNORMAL HIGH (ref 10.0–149.0)
VLDL: 37 mg/dL (ref 0.0–40.0)

## 2024-09-25 NOTE — Patient Instructions (Addendum)
 [email or web address]  You can look into above website to find therapists near you.   I am referring you to general surgery for evaluation of gluteal lesion. If you don't hear from them within couple of days please call us . Warm water soaking is recommended.     I am referring you to a podiatrist.

## 2024-09-25 NOTE — Assessment & Plan Note (Addendum)
 Not compliant with prescribed Metformin . Repeat A1c. Advised restarting metformin  if blood glucose remains high. Orders:   Comp Met (CMET)   Lipid panel

## 2024-09-25 NOTE — Assessment & Plan Note (Signed)
 Not compliant with prescribed Metformin . Repeat A1c.  Orders:   HgB A1c

## 2024-09-25 NOTE — Assessment & Plan Note (Addendum)
 No SI/HI.  Significant stress and anxiety. Declines psychiatric medication due to side effect concerns. Referred to social worker for additional support and resources. Offered referral to psychologist or psychiatrist if she changes her mind. Orders:   AMB Referral [facility]

## 2024-09-25 NOTE — Assessment & Plan Note (Addendum)
 Finished Doxycycline  on 09/24/24. Needs surgical evaluation for I&D, urgent referral made to general surgery for further evaluation.  Advised warm water soaking to reduce pressure. Orders:   Ambulatory referral to General Surgery

## 2024-09-25 NOTE — Assessment & Plan Note (Addendum)
 Podiatry referral made. Continue wearing ankle brace.  Orders:   Ambulatory referral to Podiatry

## 2024-09-25 NOTE — Assessment & Plan Note (Addendum)
 Referred to podiatry for evaluation and management of toenail issues. Orders:   Ambulatory referral to Podiatry

## 2024-09-25 NOTE — Assessment & Plan Note (Addendum)
 Surrounding care of her mom. No SI/HI.  Significant stress and anxiety. Declines psychiatric medication due to side effect concerns. Referred to social worker for additional support and resources. Offered referral to psychologist or psychiatrist if she changes her mind. Orders:   AMB Referral [facility] Care Management

## 2024-09-25 NOTE — Progress Notes (Signed)
 Established Patient Office Visit   Subjective  Patient ID: Suzanne Chandler, female    DOB: April 05, 1961  Age: 63 y.o. MRN: [id number]  Chief Complaint  Patient presents with   Anxiety   Hyperlipidemia   Diabetes    Discussed the use of AI scribe software for clinical note transcription with the patient, who gave verbal consent to proceed.  History of Present Illness Suzanne Chandler is a 63 year old female who presents for evaluation of following concerns:  Recurrent gluteal lesion She reports a painful, recurrent bump on her buttock, described as infected and surrounded by a ring. Despite finishing a course of doxycycline , the bump persists, and she has been applying colloidal silver to it. She has a history of varicose veins and mentions smoking, which she acknowledges may exacerbate her condition.  - Saw ob/gyn NP, Zuleta on 09/15/24: Folliculitis, prolapse of anterior vaginal wall, mixed urge,stress incontinance. Started on Doxycycline . She has appointment with NP, Zuleta on 10/28/24 and appointment with urogynecologist Dr. Marilynne on 11/11/24.    - MDD, anxiety, stress:  She is under significant stress related to family dynamics, particularly concerning her mother's care. She feels overwhelmed by the responsibility of managing her mother's needs, including grocery shopping and medication management, while dealing with unsupportive family members. She feels anger and frustration towards her family and is considering legal action to obtain power of attorney for her mother.  She has a history of anxiety and depression, exacerbated by her current family situation. She was previously prescribed clonazepam, which she took as needed, but is not currently on any psychiatric medications. She is reluctant to engage with mental health professionals, citing past experiences and a belief that medication only masks symptoms.  She has diabetes and is concerned about her blood sugar levels,  which she believes have worsened due to stress and inconsistent medication adherence. She has been taking berberine instead of metformin  due to gastrointestinal side effects. She also has a history of high cholesterol and has been using alternative supplements instead of statins.   She describes a hectic lifestyle, frequently traveling to manage family obligations, which has impacted her ability to maintain consistent medication adherence and self-care. She mentions missing several medical appointments due to car issues and wants to prioritize her own health amidst her current challenges.    ROS As per HPI    Objective:     BP 118/80 (BP Location: Right Arm, Patient Position: Sitting, Cuff Size: Normal)   Pulse 99   Temp 98.2 F (36.8 C) (Oral)   Ht 5' 6 (1.676 m)   Wt 198 lb 12.8 oz (90.2 kg)   SpO2 92%   BMI 32.09 kg/m      09/25/2024   11:18 AM 06/23/2024    2:57 PM 02/26/2024    3:31 PM  Depression screen PHQ 2/9  Decreased Interest 2 0 0  Down, Depressed, Hopeless 2 0 1  PHQ - 2 Score 4 0 1  Altered sleeping 3 1 2   Tired, decreased energy 2 1 0  Change in appetite 2 1 3   Feeling bad or failure about yourself  0 0 1  Trouble concentrating 1 0 0  Moving slowly or fidgety/restless 0 0 1  Suicidal thoughts 0 0 0  PHQ-9 Score 12 3  8    Difficult doing work/chores  Somewhat difficult Somewhat difficult     Data saved with a previous flowsheet row definition      09/25/2024   11:18 AM  06/23/2024    2:57 PM 02/26/2024    3:32 PM  GAD 7 : Generalized Anxiety Score  Nervous, Anxious, on Edge 3 3 3   Control/stop worrying 3 3 3   Worry too much - different things 3 3 3   Trouble relaxing 3 3 3   Restless 3 0 1  Easily annoyed or irritable 3 3 3   Afraid - awful might happen 0 2 1  Total GAD 7 Score 18 17 17   Anxiety Difficulty  Somewhat difficult Somewhat difficult      09/25/2024   11:18 AM 06/23/2024    2:57 PM 02/26/2024    3:31 PM  Depression screen PHQ 2/9   Decreased Interest 2 0 0  Down, Depressed, Hopeless 2 0 1  PHQ - 2 Score 4 0 1  Altered sleeping 3 1 2   Tired, decreased energy 2 1 0  Change in appetite 2 1 3   Feeling bad or failure about yourself  0 0 1  Trouble concentrating 1 0 0  Moving slowly or fidgety/restless 0 0 1  Suicidal thoughts 0 0 0  PHQ-9 Score 12 3  8    Difficult doing work/chores  Somewhat difficult Somewhat difficult     Data saved with a previous flowsheet row definition      09/25/2024   11:18 AM 06/23/2024    2:57 PM 02/26/2024    3:32 PM  GAD 7 : Generalized Anxiety Score  Nervous, Anxious, on Edge 3 3 3   Control/stop worrying 3 3 3   Worry too much - different things 3 3 3   Trouble relaxing 3 3 3   Restless 3 0 1  Easily annoyed or irritable 3 3 3   Afraid - awful might happen 0 2 1  Total GAD 7 Score 18 17 17   Anxiety Difficulty  Somewhat difficult Somewhat difficult   SDOH Screenings   Food Insecurity: No Food Insecurity (06/20/2024)  Housing: Unknown (06/20/2024)  Transportation Needs: No Transportation Needs (06/20/2024)  Depression (PHQ2-9): High Risk (09/25/2024)  Financial Resource Strain: Low Risk (06/20/2024)  Physical Activity: Insufficiently Active (06/20/2024)  Social Connections: Socially Isolated (06/20/2024)  Stress: Stress Concern Present (06/20/2024)  Tobacco Use: High Risk (09/25/2024)     Physical Exam Constitutional:      Appearance: Normal appearance.  HENT:     Head: Normocephalic and atraumatic.     Mouth/Throat:     Mouth: Mucous membranes are moist.  Neck:     Thyroid : No thyroid  mass or thyroid  tenderness.  Cardiovascular:     Rate and Rhythm: Normal rate and regular rhythm.  Pulmonary:     Effort: Pulmonary effort is normal.     Breath sounds: Wheezing present.  Abdominal:     General: Bowel sounds are normal.     Palpations: Abdomen is soft.     Tenderness: There is no guarding.  Musculoskeletal:     Cervical back: Neck supple. No rigidity or tenderness.      Right lower leg: No edema.     Left lower leg: No edema.  Skin:    General: Skin is warm.     Comments: Left gluteal: Well demarcated erythematous plaque like lesion measuring about 8 cm in diameter with surrounding induration, central hyperpigmentation  Neurological:     Mental Status: She is alert and oriented to person, place, and time.     Gait: Gait normal.  Psychiatric:        Mood and Affect: Mood is anxious. Affect is labile and tearful.  Thought Content: Thought content does not include homicidal or suicidal ideation. Thought content does not include homicidal or suicidal plan.        No results found for any visits on 09/25/24.  The 10-year ASCVD risk score (Arnett DK, et al., 2019) is: 20.7%     Assessment & Plan:   Assessment & Plan Severe episode of recurrent major depressive disorder, without psychotic features (HCC) No SI/HI.  Significant stress and anxiety. Declines psychiatric medication due to side effect concerns. Referred to social worker for additional support and resources. Offered referral to psychologist or psychiatrist if she changes her mind. Orders:   AMB Referral [facility] Care Management  GAD (generalized anxiety disorder) No SI/HI.  Significant stress and anxiety. Declines psychiatric medication due to side effect concerns. Referred to social worker for additional support and resources. Offered referral to psychologist or psychiatrist if she changes her mind. Orders:   AMB Referral [facility] Care Management  Caregiver stress Surrounding care of her mom. No SI/HI.  Significant stress and anxiety. Declines psychiatric medication due to side effect concerns. Referred to social worker for additional support and resources. Offered referral to psychologist or psychiatrist if she changes her mind. Orders:   AMB Referral [facility] Care Management  DM type 2 with diabetic mixed hyperlipidemia (HCC) Not compliant with prescribed Metformin . Repeat A1c.  Advised restarting metformin  if blood glucose remains high. Orders:   Comp Met (CMET)   Lipid panel  Mixed hyperlipidemia Not compliant with prescribed Rosuvastatin  5 mg, check lipid panel.     Gluteal abscess Finished Doxycycline  on 09/24/24. Needs surgical evaluation for I&D, urgent referral made to general surgery for further evaluation.  Advised warm water soaking to reduce pressure. Orders:   Ambulatory referral to General Surgery  Onychomycosis  Referred to podiatry for evaluation and management of toenail issues. Orders:   Ambulatory referral to Podiatry  Chronic pain of right ankle Podiatry referral made. Continue wearing ankle brace.  Orders:   Ambulatory referral to Podiatry  DM (diabetes mellitus), type 2 with complications (HCC) Not compliant with prescribed Metformin . Repeat A1c.  Orders:   HgB A1c     Return in about 3 months (around 12/24/2024) for Chronic follow up .   Luke Shade, MD

## 2024-09-25 NOTE — Assessment & Plan Note (Addendum)
 Not compliant with prescribed Rosuvastatin  5 mg, check lipid panel.

## 2024-09-29 ENCOUNTER — Ambulatory Visit: Payer: Self-pay | Admitting: Surgery

## 2024-09-29 ENCOUNTER — Encounter: Payer: Self-pay | Admitting: Surgery

## 2024-09-29 VITALS — BP 128/83 | HR 71 | Temp 97.8°F | Ht 66.0 in | Wt 197.2 lb

## 2024-09-29 DIAGNOSIS — K611 Rectal abscess: Secondary | ICD-10-CM | POA: Diagnosis not present

## 2024-09-29 NOTE — Patient Instructions (Signed)
Today we have drained your Abscess in the office. The numbing medication will wear off in approximately 4-8 hours. You will have some pain to the area afterwards but should not be as severe as prior to the procedure.  If you have been given antibiotics, please continue to take them after your procedure.  You may take 2 extra strength Tylenol, or 3 regular Ibuprofen tablets every 6 hours as needed for pain and discomfort.  You may shower. First remove all of the packing and wash letting the warm soapy water run over the area, rinse well, and pat dry.    We will see you back as scheduled below.   If you have any questions or concerns prior to your appointment, please call our office and speak with a nurse.  Incision and Drainage Incision and drainage is a surgical procedure to open and drain a fluid-filled sac. The sac may be filled with pus, mucus, or blood. Examples of fluid-filled sacs that may need surgical drainage include cysts, skin infections (abscesses), and red lumps that develop from a ruptured cyst or a small abscess (boils). You may need this procedure if the affected area is large, painful, infected, or not healing well. Tell a health care provider about: Any allergies you have. All medicines you are taking, including vitamins, herbs, eye drops, creams, and over-the-counter medicines. Any problems you or family members have had with anesthetic medicines. Any blood disorders you have. Any surgeries you have had. Any medical conditions you have. Whether you are pregnant or may be pregnant. What are the risks? Generally, this is a safe procedure. However, problems may occur, including: Infection. Bleeding. Allergic reactions to medicines. Scarring.  What happens before the procedure? You may need an ultrasound or other imaging tests to see how large or deep the fluid-filled sac is. You may have blood tests to check for infection. You may get a tetanus shot. You may be  given antibiotic medicine to help prevent infection. Follow instructions from your health care provider about eating or drinking restrictions. Ask your health care provider about: Changing or stopping your regular medicines. This is especially important if you are taking diabetes medicines or blood thinners. Taking medicines such as aspirin and ibuprofen. These medicines can thin your blood. Do not take these medicines before your procedure if your health care provider instructs you not to. Plan to have someone take you home after the procedure. If you will be going home right after the procedure, plan to have someone stay with you for 24 hours. What happens during the procedure? To reduce your risk of infection: Your health care team will wash or sanitize their hands. Your skin will be washed with soap. You will be given one or more of the following: A medicine to help you relax (sedative). A medicine to numb the area (local anesthetic). A medicine to make you fall asleep (general anesthetic). An incision will be made in the top of the fluid-filled sac. The contents of the sac may be squeezed out, or a syringe or tube (catheter) may be used to empty the sac. The catheter may be left in place for several weeks to drain any fluid. Or, your health care provider may stitch open the edges of the incision to make a long-term opening for drainage (marsupialization). The inside of the sac may be washed out (irrigated) with a sterile solution and packed with gauze before it is covered with a bandage (dressing). The procedure may vary among health care  providers and hospitals. What happens after the procedure? Your blood pressure, heart rate, breathing rate, and blood oxygen level will be monitored often until the medicines you were given have worn off. Do not drive for 24 hours if you received a sedative. This information is not intended to replace advice given to you by your health care provider.  Make sure you discuss any questions you have with your health care provider. Document Released: 03/21/2001 Document Revised: 03/02/2016 Document Reviewed: 07/16/2015 Elsevier Interactive Patient Education  2017 [employer].   Incision and Drainage, Care After Refer to this sheet in the next few weeks. These instructions provide you with information about caring for yourself after your procedure. Your health care provider may also give you more specific instructions. Your treatment has been planned according to current medical practices, but problems sometimes occur. Call your health care provider if you have any problems or questions after your procedure. What can I expect after the procedure? After the procedure, it is common to have: Pain or discomfort around your incision site. Drainage from your incision.  Follow these instructions at home: Take over-the-counter and prescription medicines only as told by your health care provider. If you were prescribed an antibiotic medicine, take it as told by your health care provider. Do not stop taking the antibiotic even if you start to feel better. Follow instructions from your health care provider about: How to take care of your incision. When and how you should change your packing and bandage (dressing). Wash your hands with soap and water before you change your dressing. If soap and water are not available, use hand sanitizer. When you should remove your dressing. Do not take baths, swim, or use a hot tub until your health care provider approves. Keep all follow-up visits as told by your health care provider. This is important. Check your incision area every day for signs of infection. Check for: More redness, swelling, or pain. More fluid or blood. Warmth. Pus or a bad smell. Contact a health care provider if: Your cyst or abscess returns. You have a fever. You have more redness, swelling, or pain around your incision. You have more  fluid or blood coming from your incision. Your incision feels warm to the touch. You have pus or a bad smell coming from your incision. Get help right away if: You have severe pain or bleeding. You cannot eat or drink without vomiting. You have decreased urine output. You become short of breath. You have chest pain. You cough up blood. The area where the incision and drainage occurred becomes numb or it tingles. This information is not intended to replace advice given to you by your health care provider. Make sure you discuss any questions you have with your health care provider. Document Released: 12/18/2011 Document Revised: 02/25/2016 Document Reviewed: 07/16/2015 Elsevier Interactive Patient Education  2017 [employer].

## 2024-09-30 ENCOUNTER — Other Ambulatory Visit: Payer: Self-pay | Admitting: *Deleted

## 2024-09-30 DIAGNOSIS — L0231 Cutaneous abscess of buttock: Secondary | ICD-10-CM

## 2024-09-30 MED ORDER — AMOXICILLIN-POT CLAVULANATE 875-125 MG PO TABS: 1.0000 | ORAL_TABLET | Freq: Two times a day (BID) | ORAL | 0 refills | Status: AC

## 2024-09-30 NOTE — Progress Notes (Signed)
 Patient ID: Suzanne Chandler, female   DOB: Sep 22, 1961, 63 y.o.   MRN: [id number]  HPI Suzanne Chandler is a 63 y.o. female in consultation at the request of Dr. Abbey. She reports a painful, recurrent bump on her left  buttock, described as infected and surrounded by a ring. Despite finishing a course of doxycycline , the bump persists, and she has been applying colloidal silver to it.  She has been doing sitz baths but lesions persist.  It is sharp moderate intensity and worsening when she sits on her left side.  No fevers no chills.  She does have a history of diabetes. Hemoglobin A1c is 6.5, recent CMP is normal. He is independent is able to perform more than 4 METS of activity without shortness of breath or chest pain.  She smokes  HPI  Past Medical History:  Diagnosis Date   Anxiety Lifetime; tremendous stressful life events.   Stress due to caregiving and family   Anxiety and depression    Arthritis Orthopedic Dr. advised may have slight arthritis.   Minimum ; foot and knees   Bipolar disorder (HCC)    Borderline diabetes    Colon polyps    Depression Lifetime; not deep ; treated for.   Do have stressful issues.   Female bladder prolapse    Hyperlipemia    Urinary incontinence    Urinary tract infection     Past Surgical History:  Procedure Laterality Date   ABDOMINAL HYSTERECTOMY  2014   APPENDECTOMY     BREAST EXCISIONAL BIOPSY Right 2012   neg   BREAST SURGERY  Benign lump removal right breast behind areola.   CHOLECYSTECTOMY  1985   COLON SURGERY  03/2017; Polyps removal.   2x 2 types Polyps removed   CYSTOCELE REPAIR  2015   TUBAL LIGATION  1987    Family History  Problem Relation Age of Onset   Ovarian cancer Sister    Arthritis Sister    COPD Sister    Anxiety disorder Sister    Depression Sister    Hyperlipidemia Mother    Hypertension Mother    Diabetes Mother    Depression Mother    Kidney disease Mother    Obesity Mother    Vision loss  Mother    Varicose Veins Mother    Heart disease Father    Stroke Father    Heart attack Father    Early death Father    Hyperlipidemia Father    Depression Brother    Anxiety disorder Brother    Stroke Maternal Grandfather    Diabetes Maternal Grandmother    Heart disease Maternal Grandmother    Hypertension Maternal Grandmother    Vision loss Maternal Grandmother    Varicose Veins Maternal Grandmother    Alcohol abuse Son    Anxiety disorder Son    Depression Son    Cancer Paternal Aunt    Cancer Paternal Uncle    Cancer Sister    Depression Maternal Uncle    Diabetes Maternal Uncle    Depression Daughter    Obesity Daughter    Vision loss Daughter    Diabetes Maternal Aunt    Hypertension Maternal Aunt    Kidney disease Maternal Aunt    Diabetes Maternal Aunt    Heart disease Paternal Aunt    Heart disease Paternal Uncle    Heart disease Paternal Uncle    Hyperlipidemia Son    Hypertension Son    Stroke Maternal Uncle  Alcohol abuse Son    Anxiety disorder Son    Depression Son    Cancer Paternal Aunt    Cancer Paternal Uncle    Depression Daughter    Vision loss Daughter    Diabetes Maternal Aunt    Hypertension Maternal Aunt    Kidney disease Maternal Aunt    Diabetes Maternal Aunt    Diabetes Maternal Uncle    Heart disease Paternal Aunt    Heart disease Paternal Uncle    Heart disease Paternal Uncle    Hyperlipidemia Son    Hypertension Son    Stroke Maternal Uncle    Colon cancer Neg Hx    Esophageal cancer Neg Hx    Rectal cancer Neg Hx    Stomach cancer Neg Hx     Social History Social History[1]  Allergies[2]  Current Outpatient Medications  Medication Sig Dispense Refill   Ascorbic Acid (VITAMIN C) 1000 MG tablet Take 1,000 mg by mouth daily.     Cholecalciferol (VITAMIN D  PO) Take 5,000 Units by mouth daily.     Coenzyme Q10 (CO Q-10) 400 MG CAPS      estradiol  (ESTRACE  VAGINAL) 0.1 MG/GM vaginal cream Place 1 Applicatorful  vaginally at bedtime as needed (vaginal dryness). 42.5 g 12   GAMMA AMINOBUTYRIC ACID PO Take 750 mg by mouth daily.     magnesium gluconate (MAGONATE) 500 (27 Mg) MG TABS tablet Take 400 mg by mouth daily.     Menaquinone-7 (K2 PO) Take 100 mcg by mouth daily.     Omega 3 1000 MG CAPS      Plant Sterols and Stanols (CHOLEST OFF PO) Take 2 capsules by mouth daily.      zinc gluconate 50 MG tablet Take 30 mg by mouth daily.     No current facility-administered medications for this visit.     Review of Systems Full ROS  was asked and was negative except for the information on the HPI  Physical Exam Blood pressure 128/83, pulse 71, temperature 97.8 F (36.6 C), temperature source Oral, height 5' 6 (1.676 m), weight 197 lb 3.2 oz (89.4 kg), SpO2 97%. CONSTITUTIONAL: NAD. EYES: Pupils are equal, round, Sclera are non-icteric. EARS, NOSE, MOUTH AND THROAT: The oropharynx is clear. The oral mucosa is pink and moist. Hearing is intact to voice. LYMPH NODES:  Lymph nodes in the neck are normal. RESPIRATORY:  Lungs are clear. There is normal respiratory effort, with equal breath sounds bilaterally, and without pathologic use of accessory muscles. CARDIOVASCULAR: Heart is regular without murmurs, gallops, or rubs. GI: The abdomen is  soft, nontender, and nondistended. There are no palpable masses. There is no hepatosplenomegaly. There are normal bowel sounds in all quadrants.  Rectal: Left ischiorectal abscess, tender to palpation w fluctuance and erythema   MUSCULOSKELETAL: Normal muscle strength and tone. No cyanosis or edema.   SKIN: Turgor is good and there are no pathologic skin lesions or ulcers. NEUROLOGIC: Motor and sensation is grossly normal. Cranial nerves are grossly intact. PSYCH:  Oriented to person, place and time. Affect is normal.  Data Reviewed I have personally reviewed the patient's imaging, laboratory findings and medical records.    Assessment/Plan 63 year old female  with left perirectal abscess.  Discussed with the patient in detail about her disease process.  We definitely need to proceed with drainage.  Discussed with her options of potentially performing this at bedside with local anesthetic versus doing it in the OR.  Patient wishes to have this performed  here in the office.  Procedure discussed with patient in detail.  The risk the benefits and possible complications including but not limited to: Bleeding, infection partial drainage, need for another procedure.  She understands and wishes to proceed  I personally spent a total of 45 minutes in the care of the patient today including performing a medically appropriate exam/evaluation, counseling and educating, placing orders, referring and communicating with other health care professionals, documenting clinical information in the EHR, independently interpreting and reviewing images studies and coordinating care.    PROCEDURE NOTE  PRE-OPERATIVE DIAGNOSIS:  right perirectal abscess  POST-OPERATIVE DIAGNOSIS:  Same  PROCEDURE:   1. Incision and drainage of complex Left perirectal abscess   ANESTHESIA: lidocaine 1% w epi  FINDINGS: purulence, perirectal abscess  EBL: minimal  Patient was explained about the procedure in detail. Risks, benefits, possible complications and a consent was obtained. The patient taken to the operating room and placed in the prone position. Patient was prepped and draped in the usual sterile fashion with antiseptic solution Local anesthetic infiltrated. Using a 11 blade knife abscess was drained with a bout 4 cc pus. All loculation were broken down, hemostasis obtained with pressure.  Irrigation with normal saline and the wound. Wound was covered with bandaid  Laneta JULIANNA Luna, MD   Laneta Luna, MD FACS General Surgeon 09/30/2024, 7:40 AM      [1]  Social History Tobacco Use   Smoking status: Every Day    Current packs/day: 1.00    Average packs/day: 1 pack/day for  33.0 years (33.0 ttl pk-yrs)    Types: Cigarettes   Smokeless tobacco: Never  Vaping Use   Vaping status: Never Used  Substance Use Topics   Alcohol use: No   Drug use: No  [2] No Known Allergies

## 2024-10-03 ENCOUNTER — Telehealth: Payer: Self-pay

## 2024-10-03 NOTE — Progress Notes (Signed)
 Complex Care Management Note Care Guide Note  10/03/2024 Name: Suzanne Chandler MRN: [id number] DOB: Aug 02, 1961   Complex Care Management Outreach Attempts: An unsuccessful telephone outreach was attempted today to offer the patient information about available complex care management services.  Follow Up Plan:  Additional outreach attempts will be made to offer the patient complex care management information and services.   Encounter Outcome:  No Answer  [name]  [facility], [facility] VBCI Assistant Direct Dial: [phone]  Fax: [phone]

## 2024-10-06 ENCOUNTER — Ambulatory Visit: Payer: Self-pay | Admitting: Podiatry

## 2024-10-13 NOTE — Progress Notes (Signed)
 Complex Care Management Note Care Guide Note  10/13/2024 Name: Suzanne Chandler MRN: [id number] DOB: 10-01-1961   Complex Care Management Outreach Attempts: A second unsuccessful outreach was attempted today to offer the patient with information about available complex care management services.  Follow Up Plan:  Additional outreach attempts will be made to offer the patient complex care management information and services.   Encounter Outcome:  No Answer  [name]  [facility], [facility] VBCI Assistant Direct Dial: [phone]  Fax: [phone]

## 2024-10-14 NOTE — Progress Notes (Signed)
 Complex Care Management Note  Care Guide Note 10/14/2024 Name: Suzanne Chandler MRN: [id number] DOB: [birth year]  Suzanne Chandler is a 64 y.o. year old female who sees Bair, Kalpana, MD for primary care. I reached out to Suzanne Chandler by phone today to offer complex care management services.  Suzanne Chandler was given information about Complex Care Management services today including:   The Complex Care Management services include support from the care team which includes your Nurse Care Manager, Clinical Social Worker, or Pharmacist.  The Complex Care Management team is here to help remove barriers to the health concerns and goals most important to you. Complex Care Management services are voluntary, and the patient may decline or stop services at any time by request to their care team member.   Complex Care Management Consent Status: Patient agreed to services and verbal consent obtained.   Follow up plan:  Telephone appointment with complex care management team member scheduled for:  11/04/24 at 2:00 p.m.   Encounter Outcome:  Patient Scheduled  [facility]  [facility], [facility] VBCI Assistant Direct Dial: [phone]  Fax: [phone]

## 2024-10-17 ENCOUNTER — Ambulatory Visit: Admitting: Podiatry

## 2024-10-20 ENCOUNTER — Ambulatory Visit: Admitting: Surgery

## 2024-10-20 ENCOUNTER — Encounter: Payer: Self-pay | Admitting: Surgery

## 2024-10-20 ENCOUNTER — Encounter: Payer: Self-pay | Admitting: *Deleted

## 2024-10-20 VITALS — BP 133/71 | HR 84 | Ht 66.0 in | Wt 194.0 lb

## 2024-10-20 DIAGNOSIS — K611 Rectal abscess: Secondary | ICD-10-CM

## 2024-10-20 DIAGNOSIS — Z09 Encounter for follow-up examination after completed treatment for conditions other than malignant neoplasm: Secondary | ICD-10-CM

## 2024-10-20 NOTE — Patient Instructions (Signed)
Please call the office if you have any questions or concerns. 

## 2024-10-20 NOTE — Progress Notes (Unsigned)
 Outpatient Surgical Follow Up  10/20/2024  Suzanne Chandler is an 64 y.o. female.   Chief Complaint  Patient presents with   Follow-up    HPI: s/p I/d perirectal abscess, overall improving, no more drainage. No fevers or chills. She Has some rapid thoughts  Past Medical History:  Diagnosis Date   Anxiety Lifetime; tremendous stressful life events.   Stress due to caregiving and family   Anxiety and depression    Arthritis Orthopedic Dr. advised may have slight arthritis.   Minimum ; foot and knees   Bipolar disorder (HCC)    Borderline diabetes    Colon polyps    Depression Lifetime; not deep ; treated for.   Do have stressful issues.   Female bladder prolapse    Hyperlipemia    Urinary incontinence    Urinary tract infection     Past Surgical History:  Procedure Laterality Date   ABDOMINAL HYSTERECTOMY  2014   APPENDECTOMY     BREAST EXCISIONAL BIOPSY Right 2012   neg   BREAST SURGERY  Benign lump removal right breast behind areola.   CHOLECYSTECTOMY  1985   COLON SURGERY  03/2017; Polyps removal.   2x 2 types Polyps removed   CYSTOCELE REPAIR  2015   TUBAL LIGATION  1987    Family History  Problem Relation Age of Onset   Ovarian cancer Sister    Arthritis Sister    COPD Sister    Anxiety disorder Sister    Depression Sister    Hyperlipidemia Mother    Hypertension Mother    Diabetes Mother    Depression Mother    Kidney disease Mother    Obesity Mother    Vision loss Mother    Varicose Veins Mother    Heart disease Father    Stroke Father    Heart attack Father    Early death Father    Hyperlipidemia Father    Depression Brother    Anxiety disorder Brother    Stroke Maternal Grandfather    Diabetes Maternal Grandmother    Heart disease Maternal Grandmother    Hypertension Maternal Grandmother    Vision loss Maternal Grandmother    Varicose Veins Maternal Grandmother    Alcohol abuse Son    Anxiety disorder Son    Depression Son     Cancer Paternal Aunt    Cancer Paternal Uncle    Cancer Sister    Depression Maternal Uncle    Diabetes Maternal Uncle    Depression Daughter    Obesity Daughter    Vision loss Daughter    Diabetes Maternal Aunt    Hypertension Maternal Aunt    Kidney disease Maternal Aunt    Diabetes Maternal Aunt    Heart disease Paternal Aunt    Heart disease Paternal Uncle    Heart disease Paternal Uncle    Hyperlipidemia Son    Hypertension Son    Stroke Maternal Uncle    Alcohol abuse Son    Anxiety disorder Son    Depression Son    Cancer Paternal Aunt    Cancer Paternal Uncle    Depression Daughter    Vision loss Daughter    Diabetes Maternal Aunt    Hypertension Maternal Aunt    Kidney disease Maternal Aunt    Diabetes Maternal Aunt    Diabetes Maternal Uncle    Heart disease Paternal Aunt    Heart disease Paternal Uncle    Heart disease Paternal Uncle    Hyperlipidemia  Son    Hypertension Son    Stroke Maternal Uncle    Colon cancer Neg Hx    Esophageal cancer Neg Hx    Rectal cancer Neg Hx    Stomach cancer Neg Hx     Social History:  reports that she has been smoking cigarettes. She has a 33 pack-year smoking history. She has never used smokeless tobacco. She reports that she does not drink alcohol and does not use drugs.  Allergies: Allergies[1]  Medications reviewed.    ROS Full ROS performed and is otherwise negative other than what is stated in HPI   BP 133/71   Pulse 84   Ht 5' 6 (1.676 m)   Wt 194 lb (88 kg)   SpO2 97%   BMI 31.31 kg/m   Physical Exam NAd alert Anxious Wound healing well with scab formation w significant improvement, no un drained collections    Assessment/Plan: Responded to I/D  No surgical issues at this time RTC prn  Laneta Luna, MD FACS General Surgeon     [1] No Known Allergies

## 2024-10-22 ENCOUNTER — Ambulatory Visit: Admitting: Podiatry

## 2024-10-22 ENCOUNTER — Ambulatory Visit

## 2024-10-22 DIAGNOSIS — D492 Neoplasm of unspecified behavior of bone, soft tissue, and skin: Secondary | ICD-10-CM | POA: Diagnosis not present

## 2024-10-22 NOTE — Progress Notes (Signed)
 "   Chief Complaint  Patient presents with   Callouses    Right hallux, plantar, calloused. Possible PW. She stated she had scrapped it off and a hard piece came out.  A1c was 6.5 in Dec No anti coag.    Discussed the use of AI scribe software for clinical note transcription with the patient, who gave verbal consent to proceed.  History of Present Illness Suzanne Chandler is a 64 year old female who presents for evaluation of a painful lesion on the plantar aspect of the right hallux.  For approximately four months, she has experienced a painful skin lesion on the plantar surface of the right great toe. The lesion is described as deep and causes significant pain with ambulation, limiting her ability to walk comfortably.  She initially believed the lesion to be a callus and attempted self-care by shaving the area and applying castor oil, without improvement. Her daughter suggested the lesion may be a wart, and she has noted that it does not resemble a typical callus.  She denies any history of trauma, stepping on sharp objects, or injury to the area. She remains physically active and frequently on her feet. She has not sought medical attention previously due to personal obligations and caregiving responsibilities.     Past Medical History:  Diagnosis Date   Anxiety Lifetime; tremendous stressful life events.   Stress due to caregiving and family   Anxiety and depression    Arthritis Orthopedic Dr. advised may have slight arthritis.   Minimum ; foot and knees   Bipolar disorder (HCC)    Borderline diabetes    Colon polyps    Depression Lifetime; not deep ; treated for.   Do have stressful issues.   Female bladder prolapse    Hyperlipemia    Urinary incontinence    Urinary tract infection    Past Surgical History:  Procedure Laterality Date   ABDOMINAL HYSTERECTOMY  2014   APPENDECTOMY     BREAST EXCISIONAL BIOPSY Right 2012   neg   BREAST SURGERY  Benign lump removal  right breast behind areola.   CHOLECYSTECTOMY  1985   COLON SURGERY  03/2017; Polyps removal.   2x 2 types Polyps removed   CYSTOCELE REPAIR  2015   TUBAL LIGATION  1987   Allergies[1]  Physical Exam Palpable pedal pulses.  Painful skin neoplasm on the plantar aspect of the right hallux IPJ.  No surrounding erythema or edema is noted.  No evidence of foreign body seen.  Epicritic sensation is intact.  Results Radiology Right hallux interphalangeal joint radiographs (10/22/2024): Accessory ossicle at the interphalangeal joint (Independently interpreted)  Shaving of skin neoplasm, right hallux plantar aspect Hyperkeratotic lesion shaved from plantar aspect of right hallux interphalangeal joint. Lesion characteristics unclear; differential includes verruca versus focal corn.    Assessment/Plan of Care: 1. 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 Differential diagnosis included verruca versus focal corn. Radiographs demonstrated an accessory bone at the interphalangeal joint, which is likely the underlying etiology. Recurrence is possible if the accessory bone continues to exert pressure. - Shaved the hyperkeratotic lesion on the plantar aspect of the right hallux with a sterile #313 blade. - Ordered and reviewed radiographs, which confirmed an accessory bone at the interphalangeal joint. - Recommended follow-up  in 3 to 4 weeks to assess for recurrence. - Discussed that excision of the accessory bone may be indicated if the lesion recurs.   Awanda CHARM Imperial, DPM, FACFAS [facility]     [address] [place], [place] [address]                Office [phone]  Fax [phone]      [1] No Known Allergies  "

## 2024-10-23 ENCOUNTER — Ambulatory Visit: Admitting: Dermatology

## 2024-10-28 ENCOUNTER — Encounter: Admitting: Obstetrics and Gynecology

## 2024-11-04 ENCOUNTER — Other Ambulatory Visit: Payer: Self-pay | Admitting: *Deleted

## 2024-11-04 NOTE — Patient Outreach (Signed)
 Phone call to patient to follow up on referral to address depression and caregiver stress. Patient states that her mother is now in long term care.  Per patient, she is feeling much better knowing that her mother is in a safe place. Denied need for further follow up at this time.   Monic Engelmann, LCSW [place]  [facility], [facility] Licensed Clinical Social Worker  Direct Dial: [phone]

## 2024-11-11 ENCOUNTER — Ambulatory Visit: Admitting: Obstetrics and Gynecology

## 2024-11-14 ENCOUNTER — Telehealth: Payer: Self-pay | Admitting: *Deleted

## 2024-11-14 NOTE — Patient Outreach (Signed)
 Patient called upset regarding family demands and care responsibilities for her mother who is now in a nursing home. Discussed importance of self care and boundary setting. Denied thoughts of harm to self or others but was feeling overwhelmed with family struggles. Continued to reinforce self care and managing expectations with family and the nursing home. Encouraged discussing concerns with nursing home administration. CSW will follow up with patient on Monday, 11/17/24.   Suzanne Tetrault, LCSW [place]  [facility], [facility] Licensed Clinical Social Worker  Direct Dial: [phone]

## 2024-11-19 ENCOUNTER — Ambulatory Visit: Admitting: Podiatry

## 2024-12-24 ENCOUNTER — Ambulatory Visit: Admitting: Dermatology

## 2025-01-05 ENCOUNTER — Ambulatory Visit
